# Patient Record
Sex: Male | Born: 1975 | Race: Black or African American | Hispanic: No | Marital: Single | Smoking: Current every day smoker
Health system: Southern US, Community
[De-identification: ages and names within clinical notes are randomized; demographics above are authoritative.]

## PROBLEM LIST (undated history)

## (undated) DIAGNOSIS — S82899A Other fracture of unspecified lower leg, initial encounter for closed fracture: Secondary | ICD-10-CM

## (undated) HISTORY — PX: NO PAST SURGERIES: SHX2092

---

## 2012-12-01 ENCOUNTER — Emergency Department (INDEPENDENT_AMBULATORY_CARE_PROVIDER_SITE_OTHER)
Admission: EM | Admit: 2012-12-01 | Discharge: 2012-12-01 | Disposition: A | Discharge status: Home / Self Care | Payer: Commercial Indemnity | Source: Home / Self Care | Attending: Family Medicine | Admitting: Family Medicine

## 2012-12-01 ENCOUNTER — Emergency Department (INDEPENDENT_AMBULATORY_CARE_PROVIDER_SITE_OTHER): Payer: Commercial Indemnity

## 2012-12-01 ENCOUNTER — Encounter (HOSPITAL_COMMUNITY): Payer: Self-pay | Admitting: Emergency Medicine

## 2012-12-01 DIAGNOSIS — S43109A Unspecified dislocation of unspecified acromioclavicular joint, initial encounter: Secondary | ICD-10-CM

## 2012-12-01 DIAGNOSIS — S43101A Unspecified dislocation of right acromioclavicular joint, initial encounter: Secondary | ICD-10-CM

## 2012-12-01 NOTE — ED Provider Notes (Signed)
History     CSN: [id number]  Arrival date & time 12/01/12  1031   First MD Initiated Contact with Patient 12/01/12 1108      Chief Complaint  Patient presents with  . Fall    (Consider location/radiation/quality/duration/timing/severity/associated sxs/prior treatment) Patient is a 37 y.o. male presenting with fall. The history is provided by the patient.  Fall The accident occurred 12 to 24 hours ago. The fall occurred from a ladder. He fell from a height of 6 to 10 ft. He landed on grass. The point of impact was the right shoulder. The pain is mild. He was ambulatory at the scene. There was no drug use involved in the accident. Pertinent negatives include no fever, no abdominal pain and no loss of consciousness.    History reviewed. No pertinent past medical history.  History reviewed. No pertinent past surgical history.  No family history on file.  History  Substance Use Topics  . Smoking status: Current Every Day Smoker  . Smokeless tobacco: Not on file  . Alcohol Use: Yes      Review of Systems  Constitutional: Negative.  Negative for fever.  Gastrointestinal: Negative for abdominal pain.  Musculoskeletal: Positive for joint swelling.  Skin: Negative.   Neurological: Negative.  Negative for loss of consciousness.    Allergies  Review of patient's allergies indicates no known allergies.  Home Medications  No current outpatient prescriptions on file.  BP 151/109  Pulse 105  Temp(Src) 98 F (36.7 C) (Oral)  Resp 23  SpO2 100%  Physical Exam  Nursing note and vitals reviewed. Constitutional: He is oriented to person, place, and time. He appears well-developed and well-nourished. No distress.  HENT:  Head: Normocephalic and atraumatic.  Eyes: Pupils are equal, round, and reactive to light.  Neck: Normal range of motion. Neck supple.  Musculoskeletal: He exhibits tenderness.       Right shoulder: He exhibits bony tenderness and swelling. He exhibits normal  range of motion.       Arms: Neurological: He is alert and oriented to person, place, and time.  Skin: Skin is warm and dry.    ED Course  Procedures (including critical care time)  Labs Reviewed - No data to display Dg Clavicle Right  12/01/2012  *RADIOLOGY REPORT*  Clinical Data: Fall, right shoulder pain  RIGHT CLAVICLE - 2+ VIEWS  Comparison: None.  Findings: Minimal AC joint degenerative change identified.  No clavicular fracture is identified.  Right lung apex is clear.  IMPRESSION: No clavicular fracture identified.   Original Report Authenticated By: Christiana Pellant, M.D.    Dg Ac Joints  12/01/2012  *RADIOLOGY REPORT*  Clinical Data: Fall, right shoulder pain  LEFT ACROMIOCLAVICULAR JOINTS  Comparison: Right clavicle radiographs same date  Findings: No displaced clavicular fracture.  Lung apices are clear. The AC joints are normal in appearance. Mild right AC joint degenerative change noted.  IMPRESSION: No AC joint displacement identified.   Original Report Authenticated By: Christiana Pellant, M.D.      1. Acromioclavicular joint separation, type 1, right, initial encounter       MDM  X-rays reviewed and report per radiologist.         Linna Hoff, MD 12/01/12 1227

## 2012-12-01 NOTE — ED Notes (Signed)
Patient reports fall yesterday 5/1.  Patient was coming down a ladder, fell about 8 feet , landing on bush, then ground ( grassy area).  Noted right shoulder pain right away.  Last night woke with pain, and reports following internet instructions.  Patient has less swelling, less pain and greater range of motion today per patient.

## 2016-04-05 ENCOUNTER — Ambulatory Visit (HOSPITAL_COMMUNITY): Payer: Worker's Compensation

## 2016-04-05 ENCOUNTER — Emergency Department (HOSPITAL_COMMUNITY)
Admission: EM | Admit: 2016-04-05 | Discharge: 2016-04-05 | Disposition: A | Discharge status: Home / Self Care | Payer: Worker's Compensation | Attending: Emergency Medicine | Admitting: Emergency Medicine

## 2016-04-05 ENCOUNTER — Encounter (HOSPITAL_COMMUNITY): Payer: Self-pay

## 2016-04-05 DIAGNOSIS — S82841A Displaced bimalleolar fracture of right lower leg, initial encounter for closed fracture: Secondary | ICD-10-CM | POA: Diagnosis not present

## 2016-04-05 DIAGNOSIS — S92351A Displaced fracture of fifth metatarsal bone, right foot, initial encounter for closed fracture: Secondary | ICD-10-CM | POA: Insufficient documentation

## 2016-04-05 DIAGNOSIS — Y939 Activity, unspecified: Secondary | ICD-10-CM | POA: Diagnosis not present

## 2016-04-05 DIAGNOSIS — S92301A Fracture of unspecified metatarsal bone(s), right foot, initial encounter for closed fracture: Secondary | ICD-10-CM

## 2016-04-05 DIAGNOSIS — S92211A Displaced fracture of cuboid bone of right foot, initial encounter for closed fracture: Secondary | ICD-10-CM

## 2016-04-05 DIAGNOSIS — Y929 Unspecified place or not applicable: Secondary | ICD-10-CM | POA: Diagnosis not present

## 2016-04-05 DIAGNOSIS — W230XXA Caught, crushed, jammed, or pinched between moving objects, initial encounter: Secondary | ICD-10-CM | POA: Diagnosis not present

## 2016-04-05 DIAGNOSIS — Y999 Unspecified external cause status: Secondary | ICD-10-CM | POA: Insufficient documentation

## 2016-04-05 DIAGNOSIS — F17200 Nicotine dependence, unspecified, uncomplicated: Secondary | ICD-10-CM | POA: Insufficient documentation

## 2016-04-05 DIAGNOSIS — S99921A Unspecified injury of right foot, initial encounter: Secondary | ICD-10-CM | POA: Diagnosis present

## 2016-04-05 MED ORDER — NAPROXEN 500 MG PO TABS: 500.0000 mg | ORAL_TABLET | Freq: Two times a day (BID) | ORAL | 0 refills | Status: AC

## 2016-04-05 MED ORDER — HYDROCODONE-ACETAMINOPHEN 5-325 MG PO TABS: 1.0000 | ORAL_TABLET | ORAL | 0 refills | Status: DC | PRN

## 2016-04-05 NOTE — ED Triage Notes (Addendum)
Patient here with right ankle and foot pain with swelling after foot pushed by electric jack into wall, had on heavy boots, no obvious deformity, swelling noted, full ROM

## 2016-04-05 NOTE — Progress Notes (Signed)
Orthopedic Tech Progress Note Patient Details:  [name] Floyd [birth year] [id number]  Ortho Devices Type of Ortho Device: Crutches, Post (short leg) splint Ortho Device/Splint Interventions: Application   [name] 04/05/2016, 5:15 PM

## 2016-04-05 NOTE — Discharge Instructions (Signed)
Please follow up with Dr. Magnus IvanBlackman at [facility] as soon as possible. In the meantime take pain medication as prescribed as needed.

## 2016-04-05 NOTE — ED Provider Notes (Signed)
[facility]-EMERGENCY DEPT Provider Note   CSN: [id number] Arrival date & time: 04/05/16  1303    By signing my name below, I, Sonum Patel, attest that this documentation has been prepared under the direction and in the presence of Artice Holohan, [place]. Electronically Signed: Sonum Patel, Neurosurgeon. 04/05/16. 1:37 PM.  History   Chief Complaint Chief Complaint  Patient presents with  . Ankle Injury    The history is provided by the patient. No language interpreter was used.     HPI Comments: [name] Floyd is a 40 y.o. male who presents to the Emergency Department complaining of a right ankle and foot injury that occurred PTA. Patient states his foot was caught in a hand lift and became inverted. He reports mild right ankle and foot pain with associated swelling. He describes the ankle pain as an ache and reports feeling pressure from the swelling. He has taken ibuprofen 250 mg. He states he can bear weight but it is painful.    History reviewed. No pertinent past medical history.  There are no active problems to display for this patient.   History reviewed. No pertinent surgical history.     Home Medications    Prior to Admission medications   Not on File    Family History No family history on file.  Social History Social History  Substance Use Topics  . Smoking status: Current Every Day Smoker  . Smokeless tobacco: Never Used  . Alcohol use Yes     Allergies   Review of patient's allergies indicates no known allergies.   Review of Systems Review of Systems  10 Systems reviewed and all are negative for acute change except as noted in the HPI.   Physical Exam Updated Vital Signs BP 103/90   Pulse 92   Temp 97.9 F (36.6 C) (Oral)   Resp 18   SpO2 100%   Physical Exam  Constitutional: He is oriented to person, place, and time. He appears well-developed and well-nourished.  HENT:  Head: Normocephalic and atraumatic.  Cardiovascular: Normal rate and intact distal  pulses.   Pulmonary/Chest: Effort normal.  Musculoskeletal:  Right lower extremity with edema from ankle through dorsum of foot particularly along lateral dorsum. Mild limited ROM of right ankle. Tenderness to lateral path of dorsum of foot. Otherwise no other tenderness. 2+ DP/PT pulses. Brisk cap refill.   Neurological: He is alert and oriented to person, place, and time.  Skin: Skin is warm and dry.  Psychiatric: He has a normal mood and affect.  Nursing note and vitals reviewed.    ED Treatments / Results  DIAGNOSTIC STUDIES: Oxygen Saturation is 100% on RA, normal by my interpretation.    COORDINATION OF CARE: 1:37 PM Discussed treatment plan with pt at bedside and pt agreed to plan.   Labs (all labs ordered are listed, but only abnormal results are displayed) Labs Reviewed - No data to display  EKG  EKG Interpretation None       Radiology Dg Ankle Complete Right  Result Date: 04/05/2016 CLINICAL DATA:  Crush injury to right foot and ankle. Initial encounter. EXAM: RIGHT ANKLE - COMPLETE 3+ VIEW COMPARISON:  None. FINDINGS: Mildly displaced bimalleolar fracture is seen involving the lateral and medial malleoli. Overlying soft tissue swelling present. The talar dome appears intact. No visible posterior malleolar fracture. IMPRESSION: Mild displaced bimalleolar fracture involving both medial and lateral malleoli of the right ankle. Electronically Signed   By: Irish Lack M.D.   On: 04/05/2016 14:02  Dg Foot Complete Right  Result Date: 04/05/2016 CLINICAL DATA:  Crush injury to right foot and ankle. Initial encounter. EXAM: RIGHT FOOT COMPLETE - 3+ VIEW COMPARISON:  None. FINDINGS: There is a comminuted fracture of the fifth metatarsal demonstrating displacement of multiple fracture fragments. Avulsive fracture also noted on the lateral view involving the plantar aspect of the cuboid. Fracture planes are suspected to extend into the cuboid bone and the cuboid bone is also  mildly displaced in appearance. There is also likely relatively nondisplaced fracture at the level of the proximal fourth metatarsal. IMPRESSION: 1. Comminuted fracture of the fifth metatarsal with displacement of multiple fracture fragments. 2. Avulsive injury involving the plantar aspect of the cuboid. Other fracture planes also may extend into the cuboid itself and the cuboid appears rotated. 3. Probable nondisplaced/minimally displaced fracture at the base of the fourth metatarsal. Electronically Signed   By: Irish LackGlenn  Yamagata M.D.   On: 04/05/2016 14:06    Procedures Procedures (including critical care time)  Medications Ordered in ED Medications - No data to display   Initial Impression / Assessment and Plan / ED Course  I have reviewed the triage vital signs and the nursing notes.  Pertinent labs & imaging results that were available during my care of the patient were reviewed by me and considered in my medical decision making (see chart for details).  Clinical Course   X-rays reveal a mildly displaced bimalleolar fracture with comminuted fifth metatarsal fracture, cuboid avulsive fracture with rotation of the cuboid bone, and likely fourth metatarsal fracture. I spoke with Dr. Magnus IvanBlackman of ortho who recommends pt will need surgery eventually but non emergently. Pt placed in posterior short leg splint given crutches and instructions for NWB. Instructed f/u with ortho ASAP.  Final Clinical Impressions(s) / ED Diagnoses   Final diagnoses:  Bimalleolar fracture, right, closed, initial encounter  Metatarsal fracture, right, closed, initial encounter  Cuboid fracture, right, closed, initial encounter    New Prescriptions Discharge Medication List as of 04/05/2016  4:23 PM    START taking these medications   Details  HYDROcodone-acetaminophen (NORCO/VICODIN) 5-325 MG tablet Take 1-2 tablets by mouth every 4 (four) hours as needed., Starting Mon 04/05/2016, Print    naproxen (NAPROSYN) 500  MG tablet Take 1 tablet (500 mg total) by mouth 2 (two) times daily., Starting Mon 04/05/2016, Print       I personally performed the services described in this documentation, which was scribed in my presence. The recorded information has been reviewed and is accurate.    Carlene CoriaSerena Y Jensen Cheramie, PA-C 04/06/16 0830    Lorre NickAnthony Allen, MD 04/06/16 1710

## 2016-04-08 ENCOUNTER — Other Ambulatory Visit: Payer: Self-pay | Admitting: Physician Assistant

## 2016-04-13 ENCOUNTER — Encounter (HOSPITAL_COMMUNITY): Payer: Self-pay

## 2016-04-13 ENCOUNTER — Encounter (HOSPITAL_COMMUNITY)
Admission: RE | Admit: 2016-04-13 | Discharge: 2016-04-13 | Disposition: A | Discharge status: Home / Self Care | Payer: Worker's Compensation | Source: Ambulatory Visit | Attending: Orthopaedic Surgery | Admitting: Orthopaedic Surgery

## 2016-04-13 DIAGNOSIS — Z79899 Other long term (current) drug therapy: Secondary | ICD-10-CM | POA: Diagnosis not present

## 2016-04-13 DIAGNOSIS — F17210 Nicotine dependence, cigarettes, uncomplicated: Secondary | ICD-10-CM | POA: Diagnosis not present

## 2016-04-13 DIAGNOSIS — S82841A Displaced bimalleolar fracture of right lower leg, initial encounter for closed fracture: Secondary | ICD-10-CM | POA: Diagnosis not present

## 2016-04-13 DIAGNOSIS — S9781XA Crushing injury of right foot, initial encounter: Secondary | ICD-10-CM | POA: Diagnosis present

## 2016-04-13 DIAGNOSIS — D649 Anemia, unspecified: Secondary | ICD-10-CM | POA: Diagnosis not present

## 2016-04-13 DIAGNOSIS — W230XXA Caught, crushed, jammed, or pinched between moving objects, initial encounter: Secondary | ICD-10-CM | POA: Diagnosis not present

## 2016-04-13 HISTORY — DX: Other fracture of unspecified lower leg, initial encounter for closed fracture: S82.899A

## 2016-04-13 LAB — BASIC METABOLIC PANEL
Anion gap: 8 (ref 5–15)
BUN: 11 mg/dL (ref 6–20)
CHLORIDE: 105 mmol/L (ref 101–111)
CO2: 25 mmol/L (ref 22–32)
Calcium: 9.5 mg/dL (ref 8.9–10.3)
Creatinine, Ser: 0.8 mg/dL (ref 0.61–1.24)
GFR calc non Af Amer: >60 mL/min (ref >60)
Glucose, Bld: 104 mg/dL — ABNORMAL HIGH (ref 65–99)
POTASSIUM: 4 mmol/L (ref 3.5–5.1)
SODIUM: 138 mmol/L (ref 135–145)

## 2016-04-13 LAB — CBC
HEMATOCRIT: 37.8 % — AB (ref 39.0–52.0)
HEMOGLOBIN: 12.3 g/dL — AB (ref 13.0–17.0)
MCH: 31 pg (ref 26.0–34.0)
MCHC: 32.5 g/dL (ref 30.0–36.0)
MCV: 95.2 fL (ref 78.0–100.0)
Platelets: 326 10*3/uL (ref 150–400)
RBC: 3.97 MIL/uL — AB (ref 4.22–5.81)
RDW: 13.8 % (ref 11.5–15.5)
WBC: 5.8 10*3/uL (ref 4.0–10.5)

## 2016-04-13 NOTE — Pre-Procedure Instructions (Signed)
Juliann ParesBarry Lazenby  04/13/2016      [facility] [address]- [place], [place] - [address] [place] [id number] Phone: [phone] Fax: [phone]    Your procedure is scheduled on 04/15/16  [facility] Admitting at 530 A.M.  Call this number if you have problems the morning of surgery:  [phone]   Remember:  Do not eat food or drink liquids after midnight.  Take these medicines the morning of surgery with A SIP OF WATER hydrocodone if needed  STOP all herbel meds, nsaids (aleve,naproxen,advil,ibuprofen)  Starting NOW (04/13/16) including vitamins, aspirin   Do not wear jewelry, make-up or nail polish.  Do not wear lotions, powders, or perfumes, or deoderant.  Do not shave 48 hours prior to surgery.  Men may shave face and neck.  Do not bring valuables to the hospital.  [facility] is not responsible for any belongings or valuables.  Contacts, dentures or bridgework may not be worn into surgery.  Leave your suitcase in the car.  After surgery it may be brought to your room.  For patients admitted to the hospital, discharge time will be determined by your treatment team.  Patients discharged the day of surgery will not be allowed to drive home.   Name and phone number of your driver:   Special instructions:  Special Instructions: [place] - Preparing for Surgery  Before surgery, you can play an important role.  Because skin is not sterile, your skin needs to be as free of germs as possible.  You can reduce the number of germs on you skin by washing with CHG (chlorahexidine gluconate) soap before surgery.  CHG is an antiseptic cleaner which kills germs and bonds with the skin to continue killing germs even after washing.  Please DO NOT use if you have an allergy to CHG or antibacterial soaps.  If your skin becomes reddened/irritated stop using the CHG and inform your nurse when you arrive at Short Stay.  Do not  shave (including legs and underarms) for at least 48 hours prior to the first CHG shower.  You may shave your face.  Please follow these instructions carefully:   1.  Shower with CHG Soap the night before surgery and the morning of Surgery.  2.  If you choose to wash your hair, wash your hair first as usual with your normal shampoo.  3.  After you shampoo, rinse your hair and body thoroughly to remove the Shampoo.  4.  Use CHG as you would any other liquid soap.  You can apply chg directly  to the skin and wash gently with scrungie or a clean washcloth.  5.  Apply the CHG Soap to your body ONLY FROM THE NECK DOWN.  Do not use on open wounds or open sores.  Avoid contact with your eyes ears, mouth and genitals (private parts).  Wash genitals (private parts)       with your normal soap.  6.  Wash thoroughly, paying special attention to the area where your surgery will be performed.  7.  Thoroughly rinse your body with warm water from the neck down.  8.  DO NOT shower/wash with your normal soap after using and rinsing off the CHG Soap.  9.  Pat yourself dry with a clean towel.            10.  Wear clean pajamas.  11.  Place clean sheets on your bed the night of your first shower and do not sleep with pets.  Day of Surgery  Do not apply any lotions/deodorants the morning of surgery.  Please wear clean clothes to the hospital/surgery center.  Please read over the  fact sheets that you were given.

## 2016-04-14 ENCOUNTER — Ambulatory Visit
Admission: RE | Admit: 2016-04-14 | Discharge: 2016-04-14 | Disposition: A | Discharge status: Home / Self Care | Payer: Worker's Compensation | Source: Ambulatory Visit | Attending: Physician Assistant | Admitting: Physician Assistant

## 2016-04-14 ENCOUNTER — Other Ambulatory Visit: Payer: Self-pay | Admitting: Physician Assistant

## 2016-04-14 DIAGNOSIS — M79671 Pain in right foot: Secondary | ICD-10-CM

## 2016-04-15 ENCOUNTER — Ambulatory Visit (HOSPITAL_COMMUNITY): Payer: Worker's Compensation

## 2016-04-15 ENCOUNTER — Encounter (HOSPITAL_COMMUNITY)
Admission: RE | Disposition: A | Discharge status: Home / Self Care | Payer: Self-pay | Source: Ambulatory Visit | Attending: Orthopaedic Surgery

## 2016-04-15 ENCOUNTER — Observation Stay (HOSPITAL_COMMUNITY)
Admission: RE | Admit: 2016-04-15 | Discharge: 2016-04-16 | Disposition: A | Discharge status: Home / Self Care | Payer: Worker's Compensation | Source: Ambulatory Visit | Attending: Orthopaedic Surgery | Admitting: Orthopaedic Surgery

## 2016-04-15 ENCOUNTER — Ambulatory Visit (HOSPITAL_COMMUNITY): Payer: Worker's Compensation | Admitting: Anesthesiology

## 2016-04-15 ENCOUNTER — Encounter (HOSPITAL_COMMUNITY): Payer: Self-pay | Admitting: *Deleted

## 2016-04-15 DIAGNOSIS — Z9889 Other specified postprocedural states: Secondary | ICD-10-CM

## 2016-04-15 DIAGNOSIS — S9781XA Crushing injury of right foot, initial encounter: Secondary | ICD-10-CM | POA: Insufficient documentation

## 2016-04-15 DIAGNOSIS — Z79899 Other long term (current) drug therapy: Secondary | ICD-10-CM | POA: Insufficient documentation

## 2016-04-15 DIAGNOSIS — Z8781 Personal history of (healed) traumatic fracture: Secondary | ICD-10-CM

## 2016-04-15 DIAGNOSIS — F17210 Nicotine dependence, cigarettes, uncomplicated: Secondary | ICD-10-CM | POA: Insufficient documentation

## 2016-04-15 DIAGNOSIS — S82841A Displaced bimalleolar fracture of right lower leg, initial encounter for closed fracture: Secondary | ICD-10-CM | POA: Diagnosis not present

## 2016-04-15 DIAGNOSIS — Z419 Encounter for procedure for purposes other than remedying health state, unspecified: Secondary | ICD-10-CM

## 2016-04-15 DIAGNOSIS — D649 Anemia, unspecified: Secondary | ICD-10-CM | POA: Insufficient documentation

## 2016-04-15 DIAGNOSIS — W230XXA Caught, crushed, jammed, or pinched between moving objects, initial encounter: Secondary | ICD-10-CM | POA: Insufficient documentation

## 2016-04-15 HISTORY — PX: ORIF ANKLE FRACTURE: SHX5408

## 2016-04-15 HISTORY — PX: ORIF ANKLE FRACTURE BIMALLEOLAR: SUR920

## 2016-04-15 SURGERY — OPEN REDUCTION INTERNAL FIXATION (ORIF) ANKLE FRACTURE
Anesthesia: Regional | Laterality: Right

## 2016-04-15 MED ORDER — DIPHENHYDRAMINE HCL 12.5 MG/5ML PO ELIX: 12.5000 mg | ORAL_SOLUTION | ORAL | Status: DC | PRN

## 2016-04-15 MED ORDER — GLYCOPYRROLATE 0.2 MG/ML IJ SOLN
INTRAMUSCULAR | Status: DC | PRN
  Administered 2016-04-15: 0.2 mg via INTRAVENOUS

## 2016-04-15 MED ORDER — MIDAZOLAM HCL 5 MG/5ML IJ SOLN
INTRAMUSCULAR | Status: DC | PRN
  Administered 2016-04-15: 2 mg via INTRAVENOUS

## 2016-04-15 MED ORDER — LACTATED RINGERS IV SOLN
INTRAVENOUS | Status: DC | PRN
  Administered 2016-04-15 (×2): via INTRAVENOUS

## 2016-04-15 MED ORDER — CHLORHEXIDINE GLUCONATE 4 % EX LIQD: 60.0000 mL | Freq: Once | CUTANEOUS | Status: DC

## 2016-04-15 MED ORDER — EPHEDRINE SULFATE 50 MG/ML IJ SOLN
INTRAMUSCULAR | Status: DC | PRN
  Administered 2016-04-15: 5 mg via INTRAVENOUS

## 2016-04-15 MED ORDER — ONDANSETRON HCL 4 MG/2ML IJ SOLN
INTRAMUSCULAR | Status: AC
  Filled 2016-04-15: qty 2

## 2016-04-15 MED ORDER — CEFAZOLIN SODIUM-DEXTROSE 2-4 GM/100ML-% IV SOLN
2.0000 g | INTRAVENOUS | Status: AC
  Administered 2016-04-15: 2 g via INTRAVENOUS
  Filled 2016-04-15: qty 100

## 2016-04-15 MED ORDER — METHOCARBAMOL 500 MG PO TABS
500.0000 mg | ORAL_TABLET | Freq: Four times a day (QID) | ORAL | Status: DC | PRN
  Administered 2016-04-16: 500 mg via ORAL
  Filled 2016-04-15: qty 1

## 2016-04-15 MED ORDER — ONDANSETRON HCL 4 MG PO TABS: 4.0000 mg | ORAL_TABLET | Freq: Four times a day (QID) | ORAL | Status: DC | PRN

## 2016-04-15 MED ORDER — PROPOFOL 10 MG/ML IV BOLUS
INTRAVENOUS | Status: AC
  Filled 2016-04-15: qty 20

## 2016-04-15 MED ORDER — METOCLOPRAMIDE HCL 5 MG/ML IJ SOLN: 5.0000 mg | Freq: Three times a day (TID) | INTRAMUSCULAR | Status: DC | PRN

## 2016-04-15 MED ORDER — FENTANYL CITRATE (PF) 100 MCG/2ML IJ SOLN
INTRAMUSCULAR | Status: AC
  Filled 2016-04-15: qty 2

## 2016-04-15 MED ORDER — PROMETHAZINE HCL 25 MG/ML IJ SOLN: 6.2500 mg | INTRAMUSCULAR | Status: DC | PRN

## 2016-04-15 MED ORDER — BUPIVACAINE-EPINEPHRINE (PF) 0.5% -1:200000 IJ SOLN
INTRAMUSCULAR | Status: DC | PRN
  Administered 2016-04-15: 40 mL via PERINEURAL

## 2016-04-15 MED ORDER — ROCURONIUM BROMIDE 10 MG/ML (PF) SYRINGE
PREFILLED_SYRINGE | INTRAVENOUS | Status: AC
  Filled 2016-04-15: qty 10

## 2016-04-15 MED ORDER — PHENYLEPHRINE HCL 10 MG/ML IJ SOLN
INTRAMUSCULAR | Status: DC | PRN
  Administered 2016-04-15: 80 ug via INTRAVENOUS
  Administered 2016-04-15: 120 ug via INTRAVENOUS
  Administered 2016-04-15: 80 ug via INTRAVENOUS

## 2016-04-15 MED ORDER — OXYCODONE HCL 5 MG PO TABS
5.0000 mg | ORAL_TABLET | ORAL | Status: DC | PRN
  Administered 2016-04-16 (×2): 10 mg via ORAL
  Filled 2016-04-15 (×2): qty 2

## 2016-04-15 MED ORDER — ACETAMINOPHEN 325 MG PO TABS: 650.0000 mg | ORAL_TABLET | Freq: Four times a day (QID) | ORAL | Status: DC | PRN

## 2016-04-15 MED ORDER — EPHEDRINE 5 MG/ML INJ
INTRAVENOUS | Status: AC
  Filled 2016-04-15: qty 10

## 2016-04-15 MED ORDER — OXYCODONE-ACETAMINOPHEN 5-325 MG PO TABS: 1.0000 | ORAL_TABLET | ORAL | 0 refills | Status: AC | PRN

## 2016-04-15 MED ORDER — HYDROMORPHONE HCL 1 MG/ML IJ SOLN: 1.0000 mg | INTRAMUSCULAR | Status: DC | PRN

## 2016-04-15 MED ORDER — MIDAZOLAM HCL 2 MG/2ML IJ SOLN
INTRAMUSCULAR | Status: AC
  Filled 2016-04-15: qty 2

## 2016-04-15 MED ORDER — FENTANYL CITRATE (PF) 100 MCG/2ML IJ SOLN
INTRAMUSCULAR | Status: DC | PRN
  Administered 2016-04-15 (×2): 50 ug via INTRAVENOUS
  Administered 2016-04-15 (×2): 100 ug via INTRAVENOUS

## 2016-04-15 MED ORDER — LIDOCAINE 2% (20 MG/ML) 5 ML SYRINGE
INTRAMUSCULAR | Status: DC | PRN
  Administered 2016-04-15: 60 mg via INTRAVENOUS

## 2016-04-15 MED ORDER — METOCLOPRAMIDE HCL 5 MG PO TABS: 5.0000 mg | ORAL_TABLET | Freq: Three times a day (TID) | ORAL | Status: DC | PRN

## 2016-04-15 MED ORDER — DEXAMETHASONE SODIUM PHOSPHATE 4 MG/ML IJ SOLN
INTRAMUSCULAR | Status: DC | PRN
  Administered 2016-04-15: 10 mg via INTRAVENOUS

## 2016-04-15 MED ORDER — ACETAMINOPHEN 650 MG RE SUPP: 650.0000 mg | Freq: Four times a day (QID) | RECTAL | Status: DC | PRN

## 2016-04-15 MED ORDER — ONDANSETRON HCL 4 MG/2ML IJ SOLN
INTRAMUSCULAR | Status: DC | PRN
  Administered 2016-04-15: 4 mg via INTRAVENOUS

## 2016-04-15 MED ORDER — DEXAMETHASONE SODIUM PHOSPHATE 10 MG/ML IJ SOLN
INTRAMUSCULAR | Status: AC
  Filled 2016-04-15: qty 1

## 2016-04-15 MED ORDER — CEFAZOLIN IN D5W 1 GM/50ML IV SOLN
1.0000 g | Freq: Four times a day (QID) | INTRAVENOUS | Status: AC
  Administered 2016-04-15 – 2016-04-16 (×3): 1 g via INTRAVENOUS
  Filled 2016-04-15 (×3): qty 50

## 2016-04-15 MED ORDER — FENTANYL CITRATE (PF) 100 MCG/2ML IJ SOLN: 25.0000 ug | INTRAMUSCULAR | Status: DC | PRN

## 2016-04-15 MED ORDER — 0.9 % SODIUM CHLORIDE (POUR BTL) OPTIME
TOPICAL | Status: DC | PRN
  Administered 2016-04-15: 1000 mL

## 2016-04-15 MED ORDER — ONDANSETRON HCL 4 MG/2ML IJ SOLN: 4.0000 mg | Freq: Four times a day (QID) | INTRAMUSCULAR | Status: DC | PRN

## 2016-04-15 MED ORDER — PROPOFOL 10 MG/ML IV BOLUS
INTRAVENOUS | Status: DC | PRN
  Administered 2016-04-15: 200 mg via INTRAVENOUS

## 2016-04-15 MED ORDER — SODIUM CHLORIDE 0.9 % IV SOLN
INTRAVENOUS | Status: DC
  Administered 2016-04-15: 14:00:00 via INTRAVENOUS

## 2016-04-15 MED ORDER — METHOCARBAMOL 1000 MG/10ML IJ SOLN
500.0000 mg | Freq: Four times a day (QID) | INTRAVENOUS | Status: DC | PRN
  Filled 2016-04-15: qty 5

## 2016-04-15 MED ORDER — GLYCOPYRROLATE 0.2 MG/ML IV SOSY
PREFILLED_SYRINGE | INTRAVENOUS | Status: AC
  Filled 2016-04-15: qty 3

## 2016-04-15 MED ORDER — PHENYLEPHRINE 40 MCG/ML (10ML) SYRINGE FOR IV PUSH (FOR BLOOD PRESSURE SUPPORT)
PREFILLED_SYRINGE | INTRAVENOUS | Status: AC
  Filled 2016-04-15: qty 10

## 2016-04-15 MED ORDER — LIDOCAINE 2% (20 MG/ML) 5 ML SYRINGE
INTRAMUSCULAR | Status: AC
  Filled 2016-04-15: qty 5

## 2016-04-15 MED ORDER — SUCCINYLCHOLINE CHLORIDE 200 MG/10ML IV SOSY
PREFILLED_SYRINGE | INTRAVENOUS | Status: AC
  Filled 2016-04-15: qty 10

## 2016-04-15 SURGICAL SUPPLY — 65 items
BANDAGE ACE 4X5 VEL STRL LF (GAUZE/BANDAGES/DRESSINGS) ×3 IMPLANT
BANDAGE ACE 6X5 VEL STRL LF (GAUZE/BANDAGES/DRESSINGS) ×3 IMPLANT
BANDAGE ESMARK 6X9 LF (GAUZE/BANDAGES/DRESSINGS) IMPLANT
BIT DRILL 2.5X125 (BIT) ×3 IMPLANT
BNDG ESMARK 6X9 LF (GAUZE/BANDAGES/DRESSINGS)
BNDG GAUZE ELAST 4 BULKY (GAUZE/BANDAGES/DRESSINGS) ×3 IMPLANT
COVER SURGICAL LIGHT HANDLE (MISCELLANEOUS) ×3 IMPLANT
CUFF TOURNIQUET SINGLE 34IN LL (TOURNIQUET CUFF) IMPLANT
CUFF TOURNIQUET SINGLE 44IN (TOURNIQUET CUFF) IMPLANT
DRAPE C-ARM 42X72 X-RAY (DRAPES) ×3 IMPLANT
DRAPE U-SHAPE 47X51 STRL (DRAPES) ×3 IMPLANT
DRILL 2.6X122MM WL AO SHAFT (BIT) ×3 IMPLANT
DRSG PAD ABDOMINAL 8X10 ST (GAUZE/BANDAGES/DRESSINGS) ×3 IMPLANT
DURAPREP 26ML APPLICATOR (WOUND CARE) ×3 IMPLANT
ELECT REM PT RETURN 9FT ADLT (ELECTROSURGICAL) ×3
ELECTRODE REM PT RTRN 9FT ADLT (ELECTROSURGICAL) ×1 IMPLANT
GAUZE SPONGE 4X4 12PLY STRL (GAUZE/BANDAGES/DRESSINGS) ×3 IMPLANT
GAUZE XEROFORM 5X9 LF (GAUZE/BANDAGES/DRESSINGS) ×3 IMPLANT
GLOVE BIO SURGEON STRL SZ8 (GLOVE) ×3 IMPLANT
GLOVE ORTHO TXT STRL SZ7.5 (GLOVE) ×3 IMPLANT
GOWN STRL REUS W/ TWL LRG LVL3 (GOWN DISPOSABLE) ×2 IMPLANT
GOWN STRL REUS W/ TWL XL LVL3 (GOWN DISPOSABLE) ×4 IMPLANT
GOWN STRL REUS W/TWL LRG LVL3 (GOWN DISPOSABLE) ×4
GOWN STRL REUS W/TWL XL LVL3 (GOWN DISPOSABLE) ×8
K-WIRE ORTHOPEDIC 1.4X150L (WIRE) ×6
KIT BASIN OR (CUSTOM PROCEDURE TRAY) ×3 IMPLANT
KIT ROOM TURNOVER OR (KITS) ×3 IMPLANT
KWIRE ORTHOPEDIC 1.4X150L (WIRE) ×2 IMPLANT
MANIFOLD NEPTUNE II (INSTRUMENTS) ×3 IMPLANT
NEEDLE HYPO 25GX1X1/2 BEV (NEEDLE) IMPLANT
NS IRRIG 1000ML POUR BTL (IV SOLUTION) ×3 IMPLANT
PACK ORTHO EXTREMITY (CUSTOM PROCEDURE TRAY) ×3 IMPLANT
PAD ARMBOARD 7.5X6 YLW CONV (MISCELLANEOUS) ×6 IMPLANT
PAD CAST 4YDX4 CTTN HI CHSV (CAST SUPPLIES) ×1 IMPLANT
PADDING CAST COTTON 4X4 STRL (CAST SUPPLIES) ×2
PADDING CAST COTTON 6X4 STRL (CAST SUPPLIES) ×3 IMPLANT
PLATE DISTAL FIBULA 3HOLE (Plate) ×3 IMPLANT
PLATE STRAIGHT 4 HOLE (Plate) ×3 IMPLANT
PREFILTER NEPTUNE (MISCELLANEOUS) ×3 IMPLANT
SCREW BONE ANKLE 3.5X12MM (Screw) ×3 IMPLANT
SCREW BONE ANKLE 3.5X14MM (Screw) ×2 IMPLANT
SCREW BONE NON-LCKING 3.5X12MM (Screw) ×9 IMPLANT
SCREW CANC FT 20X4X2.5XHEX (Screw) ×1 IMPLANT
SCREW CANCELLOUS 4.0X20 (Screw) ×2 IMPLANT
SCREW CANCELLOUS 4.0X30MM (Screw) ×2 IMPLANT
SCREW CORTEX ST MATTA 3.5X36MM (Screw) ×3 IMPLANT
SCREW CORTEX ST MATTA 3.5X40MM (Screw) ×3 IMPLANT
SCREW LOCKING 3.5X16MM (Screw) ×6 IMPLANT
SCREW LOCKING 3.5X18MM (Screw) ×3 IMPLANT
SPLINT PLASTER CAST XFAST 5X30 (CAST SUPPLIES) ×1 IMPLANT
SPLINT PLASTER XFAST SET 5X30 (CAST SUPPLIES) ×2
SPONGE GAUZE 4X4 12PLY STER LF (GAUZE/BANDAGES/DRESSINGS) ×3 IMPLANT
SPONGE LAP 4X18 X RAY DECT (DISPOSABLE) ×6 IMPLANT
SUCTION FRAZIER HANDLE 10FR (MISCELLANEOUS) ×2
SUCTION TUBE FRAZIER 10FR DISP (MISCELLANEOUS) ×1 IMPLANT
SUT ETHILON 2 0 FS 18 (SUTURE) ×3 IMPLANT
SUT VIC AB 2-0 CT1 27 (SUTURE) ×2
SUT VIC AB 2-0 CT1 27XBRD (SUTURE) ×1 IMPLANT
SUT VICRYL 0 CT 1 36IN (SUTURE) ×3 IMPLANT
SYR CONTROL 10ML LL (SYRINGE) IMPLANT
TOWEL OR 17X24 6PK STRL BLUE (TOWEL DISPOSABLE) ×3 IMPLANT
TOWEL OR 17X26 10 PK STRL BLUE (TOWEL DISPOSABLE) ×3 IMPLANT
TUBE CONNECTING 12'X1/4 (SUCTIONS) ×1
TUBE CONNECTING 12X1/4 (SUCTIONS) ×2 IMPLANT
WATER STERILE IRR 1000ML POUR (IV SOLUTION) ×3 IMPLANT

## 2016-04-15 NOTE — Discharge Instructions (Signed)
No weight on your right foot or ankle at all. Expect swelling - ice and elevation as needed. You can try to bend your ankle back and forth. Wear your boot only when up, but you do not have to sleep in your boot. Keep your dressings clean and dry. You can remove all or your dressings in one week and start getting your actual incisions, sutures, and wounds wet daily in the shower.

## 2016-04-15 NOTE — Brief Op Note (Signed)
04/15/2016  8:52 AM  PATIENT:  [name] Floyd  [age] y.o. male  PRE-OPERATIVE DIAGNOSIS:  right bimalleolar ankle fracture  POST-OPERATIVE DIAGNOSIS:  right bimalleolar ankle fracture  PROCEDURE:  Procedure(s): OPEN REDUCTION INTERNAL FIXATION (ORIF) RIGHT BIMALLEOLAR ANKLE FRACTURE (Right)  SURGEON:  Surgeon(s) and Role:    * Kathryne Hitchhristopher Y Ketrina Boateng, MD - Primary  PHYSICIAN ASSISTANT: Rexene EdisonGil Clark, PA-C  ANESTHESIA:   regional and general  EBL:  Total I/O In: 1000 [I.V.:1000] Out: 50 [Blood:50]  COUNTS:  YES  TOURNIQUET:   Total Tourniquet Time Documented: Thigh (Right) - 61 minutes Total: Thigh (Right) - 61 minutes   DICTATION: .Other Dictation: Dictation Number [id number]  PLAN OF CARE: Admit for overnight observation  PATIENT DISPOSITION:  PACU - hemodynamically stable.   Delay start of Pharmacological VTE agent (>24hrs) due to surgical blood loss or risk of bleeding: no

## 2016-04-15 NOTE — Transfer of Care (Signed)
Immediate Anesthesia Transfer of Care Note  Patient: [name] Floyd  Procedure(s) Performed: Procedure(s): OPEN REDUCTION INTERNAL FIXATION (ORIF) RIGHT BIMALLEOLAR ANKLE FRACTURE (Right)  Patient Location: PACU  Anesthesia Type:GA combined with regional for post-op pain  Level of Consciousness: awake, alert  and oriented  Airway & Oxygen Therapy: Patient Spontanous Breathing and Patient connected to face mask oxygen  Post-op Assessment: Report given to RN and Post -op Vital signs reviewed and stable  Post vital signs: Reviewed and stable  Last Vitals:  Vitals:   04/15/16 0723 04/15/16 0905  BP:  120/82  Pulse:  96  Resp: (!) 30 20  Temp:  36.3 C    Last Pain:  Vitals:   04/15/16 0905  TempSrc:   PainSc: (P) 0-No pain         Complications: No apparent anesthesia complications

## 2016-04-15 NOTE — Anesthesia Preprocedure Evaluation (Addendum)
Anesthesia Evaluation  Patient identified by MRN, date of birth, ID band Patient awake    Reviewed: Allergy & Precautions, NPO status , Patient's Chart, lab work & pertinent test results  Airway Mallampati: II  TM Distance: >3 FB Neck ROM: Full    Dental  (+) Teeth Intact, Dental Advisory Given   Pulmonary Current Smoker,    Pulmonary exam normal breath sounds clear to auscultation       Cardiovascular Exercise Tolerance: Good negative cardio ROS Normal cardiovascular exam Rhythm:Regular Rate:Normal     Neuro/Psych negative neurological ROS     GI/Hepatic negative GI ROS, Neg liver ROS,   Endo/Other  negative endocrine ROS  Renal/GU negative Renal ROS     Musculoskeletal negative musculoskeletal ROS (+)   Abdominal   Peds  Hematology  (+) Blood dyscrasia, anemia ,   Anesthesia Other Findings Day of surgery medications reviewed with the patient.  Reproductive/Obstetrics                             Anesthesia Physical Anesthesia Plan  ASA: II  Anesthesia Plan: General and Regional   Post-op Pain Management:  Regional for Post-op pain   Induction: Intravenous  Airway Management Planned: LMA  Additional Equipment:   Intra-op Plan:   Post-operative Plan: Extubation in OR  Informed Consent: I have reviewed the patients History and Physical, chart, labs and discussed the procedure including the risks, benefits and alternatives for the proposed anesthesia with the patient or authorized representative who has indicated his/her understanding and acceptance.   Dental advisory given  Plan Discussed with: CRNA  Anesthesia Plan Comments: (Risks/benefits of general anesthesia discussed with patient including risk of damage to teeth, lips, gum, and tongue, nausea/vomiting, allergic reactions to medications, and the possibility of heart attack, stroke and death.  All patient questions  answered.  Patient wishes to proceed.  Discussed risks and benefits of popliteal nerve block including failure, bleeding, infection, nerve damage, weakness. Discussed that the block may not prevent all of the pain. Questions answered. Patient consents to block. )       Anesthesia Quick Evaluation

## 2016-04-15 NOTE — H&P (Signed)
[name] Floyd is an 40 y.o. male.   Chief Complaint:   Right ankle pain; known complex fracture HPI:   40 yo male who had his right ankle crushed in a work-related accident.  Sustained a complex, unstable right ankle fracture.  Now presents for definitive fixation.  Past Medical History:  Diagnosis Date  . Fracture of ankle    rt    Past Surgical History:  Procedure Laterality Date  . NO PAST SURGERIES      History reviewed. No pertinent family history. Social History:  reports that he has been smoking Cigarettes.  He has a 9.00 pack-year smoking history. He has never used smokeless tobacco. He reports that he does not drink alcohol or use drugs.  Allergies: No Known Allergies  Medications Prior to Admission  Medication Sig Dispense Refill  . acetaminophen (TYLENOL) 500 MG tablet Take 1,000 mg by mouth every 6 (six) hours as needed for mild pain.    Marland Kitchen HYDROcodone-acetaminophen (NORCO/VICODIN) 5-325 MG tablet Take 1-2 tablets by mouth every 4 (four) hours as needed. 15 tablet 0  . ibuprofen (ADVIL,MOTRIN) 200 MG tablet Take 800 mg by mouth every 6 (six) hours as needed for moderate pain.    . naproxen (NAPROSYN) 500 MG tablet Take 1 tablet (500 mg total) by mouth 2 (two) times daily. 30 tablet 0    Results for orders placed or performed during the hospital encounter of 04/13/16 (from the past 48 hour(s))  CBC     Status: Abnormal   Collection Time: 04/13/16  2:56 PM  Result Value Ref Range   WBC 5.8 4.0 - 10.5 K/uL   RBC 3.97 (L) 4.22 - 5.81 MIL/uL   Hemoglobin 12.3 (L) 13.0 - 17.0 g/dL   HCT 37.8 (L) 39.0 - 52.0 %   MCV 95.2 78.0 - 100.0 fL   MCH 31.0 26.0 - 34.0 pg   MCHC 32.5 30.0 - 36.0 g/dL   RDW 13.8 11.5 - 15.5 %   Platelets 326 150 - 400 K/uL  Basic metabolic panel     Status: Abnormal   Collection Time: 04/13/16  2:56 PM  Result Value Ref Range   Sodium 138 135 - 145 mmol/L   Potassium 4.0 3.5 - 5.1 mmol/L   Chloride 105 101 - 111 mmol/L   CO2 25 22 - 32 mmol/L    Glucose, Bld 104 (H) 65 - 99 mg/dL   BUN 11 6 - 20 mg/dL   Creatinine, Ser 0.80 0.61 - 1.24 mg/dL   Calcium 9.5 8.9 - 10.3 mg/dL   GFR calc non Af Amer >60 >60 mL/min   GFR calc Af Amer >60 >60 mL/min    Comment: (NOTE) The eGFR has been calculated using the CKD EPI equation. This calculation has not been validated in all clinical situations. eGFR's persistently <60 mL/min signify possible Chronic Kidney Disease.    Anion gap 8 5 - 15   Ct Foot Right Wo Contrast  Result Date: 04/14/2016 CLINICAL DATA:  Crush injury of the right foot. EXAM: CT OF THE RIGHT FOOT WITHOUT CONTRAST TECHNIQUE: Multidetector CT imaging of the right foot was performed according to the standard protocol. Multiplanar CT image reconstructions were also generated. COMPARISON:  Radiographs 04/05/2016 FINDINGS: There is a severely comminuted displaced fracture involving the fifth metatarsal shaft and base. Comminuted fracture of the cuboid with intra-articular involvement at the calcaneocuboid joint and a maximum of 4.5 mm of separation at the joint. The other metatarsal bones are intact. There are  tiny fractures at the tarsal metatarsal joints suspicious for ligamentous able shins. Comminuted fractures of the middle and lateral cuneiforms. Small avulsion fracture noted in the region of Lisfranc ligament attachment at the base of the second metatarsal. Mildly displaced fracture involving the lateral aspect of the navicular bone with mild comminution. Maximum displacement is 5 mm. No fracture of the talus is identified. The calcaneus is intact. The calcaneal facets are maintained. The talonavicular joint is maintained. Largely transverse fracture through the distal fibular shaft just above the level of the ankle mortise. There is also a vertical shear type intra-articular fracture involving the distal tibia extending to the base of the medial malleolus. A small avulsion fractures also noted along the lateral margin of the tibia,  likely an avulsion injury from the interosseous ligament. IMPRESSION: 1. Severely comminuted displaced fractures of the fifth metatarsal shaft and base. 2. Comminuted fractures of the cuboid, lateral and middle cuneiforms. 3. Mildly displaced fracture involving the lateral aspect of the navicular bone. 4. Findings suspicious for a Lisfranc ligament injury with small avulsion fractures near its attachment site. Other ligamentous injuries are suspected given the small avulsion fractures at the tarsal metatarsal joints. 5. Fractures of the distal fibula and tibia as discussed above. Electronically Signed   By: Marijo Sanes M.D.   On: 04/14/2016 17:30    Review of Systems  All other systems reviewed and are negative.   Blood pressure (!) 138/98, pulse 87, temperature 98.7 F (37.1 C), temperature source Oral, resp. rate 18, height 5' 9"  (1.753 m), weight 78.5 kg (173 lb), SpO2 100 %. Physical Exam  Constitutional: He is oriented to person, place, and time. He appears well-developed and well-nourished.  HENT:  Head: Normocephalic and atraumatic.  Eyes: EOM are normal. Pupils are equal, round, and reactive to light.  Neck: Normal range of motion. Neck supple.  Cardiovascular: Normal rate and regular rhythm.   Respiratory: Effort normal and breath sounds normal.  GI: Soft. Bowel sounds are normal.  Musculoskeletal:       Right ankle: He exhibits decreased range of motion, swelling, ecchymosis and deformity. Tenderness. Lateral malleolus and medial malleolus tenderness found.  Neurological: He is alert and oriented to person, place, and time.  Skin: Skin is warm and dry.  Psychiatric: He has a normal mood and affect.     Assessment/Plan Post-right ankle crush injury with bimalleolar fracture 1)  To the OR today for open reduction/internal fixation of his complex right ankle fracture followed by admission for pain control, IV antibiotics, and therapy.  Risks and benefits have been discussed in  detail and informed consent obtained.  Mcarthur Rossetti, MD 04/15/2016, 7:14 AM

## 2016-04-15 NOTE — Evaluation (Addendum)
Physical Therapy Evaluation Patient Details Name: [name] Floyd MRN: [id number] DOB: 06/30/1976 Today's Date: 04/15/2016   History of Present Illness  Pt is a 40 y/o male s/p ORIF R ankle fx secondary to crushing work-related injury. No pertinent PMH.  Clinical Impression  Pt presented supine in bed with HOB elevated, R LE elevated, awake and willing to participate in therapy session. Prior to admission, pt reported using bilateral axillary crutches to ambulate while maintaining NWB R LE since his injury. Pt participated in bed mobility, transfers and gait training; however, pt declining stair training at this time as he reported that he feels comfortable with ascending and descending stairs while maintaining NWB R LE. Pt would continue to benefit from skilled physical therapy services at this time while admitted and after d/c to address his below listed limitations in order to improve his overall safety and independence with functional mobility.     Follow Up Recommendations Supervision for mobility/OOB;Home health PT    Equipment Recommendations  Rolling [place] with 5" wheels    Recommendations for Other Services       Precautions / Restrictions Precautions Precautions: Fall Restrictions Weight Bearing Restrictions: Yes RLE Weight Bearing: Non weight bearing      Mobility  Bed Mobility Overal bed mobility: Needs Assistance Bed Mobility: Supine to Sit;Sit to Supine     Supine to sit: Supervision;HOB elevated Sit to supine: Supervision   General bed mobility comments: pt required increased time  Transfers Overall transfer level: Needs assistance Equipment used: Rolling Darr (2 wheeled) Transfers: Sit to/from Stand Sit to Stand: Supervision         General transfer comment: pt required increased time to complete  Ambulation/Gait Ambulation/Gait assistance: Supervision Ambulation Distance (Feet): 150 Feet (150 ft x2 with sitting rest break in between) Assistive  device: Rolling Stork (2 wheeled) Gait Pattern/deviations: Step-to pattern (hop-to pattern to maintain NWB R LE ) Gait velocity: decreased Gait velocity interpretation: Below normal speed for age/gender    Stairs            Wheelchair Mobility    Modified Rankin (Stroke Patients Only)       Balance Overall balance assessment: Needs assistance Sitting-balance support: Feet supported;No upper extremity supported Sitting balance-Leahy Scale: Good     Standing balance support: During functional activity;No upper extremity supported Standing balance-Leahy Scale: Fair                               Pertinent Vitals/Pain Pain Assessment: No/denies pain    Home Living Family/patient expects to be discharged to:: Private residence Living Arrangements: Parent Available Help at Discharge: Family;Available PRN/intermittently Type of Home: House Home Access: Stairs to enter Entrance Stairs-Rails: Can reach both Entrance Stairs-Number of Steps: 2 Home Layout: One level Home Equipment: Crutches      Prior Function Level of Independence: Independent with assistive device(s)         Comments: pt has been using crutches to ambulate since injury     Hand Dominance        Extremity/Trunk Assessment   Upper Extremity Assessment: Overall WFL for tasks assessed           Lower Extremity Assessment: RLE deficits/detail RLE Deficits / Details: Pt unable to flex/extend toes and has no sensation in toes. Pt was able to flex at hip to raise his R LE against gravity.    Cervical / Trunk Assessment: Normal  Communication  Communication: No difficulties  Cognition Arousal/Alertness: Awake/alert Behavior During Therapy: WFL for tasks assessed/performed Overall Cognitive Status: Within Functional Limits for tasks assessed                      General Comments      Exercises        Assessment/Plan    PT Assessment Patient needs continued  PT services  PT Diagnosis Difficulty walking   PT Problem List Decreased strength;Decreased range of motion;Decreased activity tolerance;Decreased balance;Decreased mobility;Decreased coordination;Decreased knowledge of use of DME  PT Treatment Interventions DME instruction;Gait training;Stair training;Functional mobility training;Therapeutic activities;Therapeutic exercise;Balance training;Neuromuscular re-education;Patient/family education   PT Goals (Current goals can be found in the Care Plan section) Acute Rehab PT Goals Patient Stated Goal: to return home PT Goal Formulation: With patient Time For Goal Achievement: 04/29/16 Potential to Achieve Goals: Good    Frequency Min 5X/week   Barriers to discharge        Co-evaluation               End of Session Equipment Utilized During Treatment: Gait belt Activity Tolerance: Patient limited by fatigue Patient left: in bed;with call bell/phone within reach;with family/visitor present Nurse Communication: Mobility status         Time: 1610-9604 PT Time Calculation (min) (ACUTE ONLY): 35 min   Charges:   PT Evaluation $PT Eval Low Complexity: 1 Procedure PT Treatments $Gait Training: 8-22 mins   PT G CodesAlessandra Bevels Lachrisha Ziebarth 04/15/2016, 4:57 PM 04/15/2016 Deborah Chalk, PT, DPT [phone]

## 2016-04-15 NOTE — Anesthesia Procedure Notes (Signed)
Procedure Name: LMA Insertion Date/Time: 04/15/2016 7:39 AM Performed by: Doyce LooseHOFFMAN, Jossalyn Forgione ANN Pre-anesthesia Checklist: Patient identified, Emergency Drugs available, Suction available and Patient being monitored Patient Re-evaluated:Patient Re-evaluated prior to inductionOxygen Delivery Method: Circle System Utilized Preoxygenation: Pre-oxygenation with 100% oxygen Intubation Type: IV induction Ventilation: Mask ventilation without difficulty LMA: LMA inserted LMA Size: 4.0 Number of attempts: 1 Airway Equipment and Method: Bite block Placement Confirmation: positive ETCO2 Tube secured with: Tape Dental Injury: Teeth and Oropharynx as per pre-operative assessment

## 2016-04-15 NOTE — Anesthesia Procedure Notes (Signed)
Anesthesia Regional Block:  Popliteal block  Pre-Anesthetic Checklist: ,, timeout performed, Correct Patient, Correct Site, Correct Laterality, Correct Procedure, Correct Position, site marked, Risks and benefits discussed,  Surgical consent,  Pre-op evaluation,  At surgeon's request and post-op pain management  Laterality: Right  Prep: chloraprep       Needles:  Injection technique: Single-shot  Needle Type: Echogenic Needle     Needle Length: 9cm 9 cm Needle Gauge: 21 and 21 G    Additional Needles:  Procedures: ultrasound guided (picture in chart) Popliteal block Narrative:  Injection made incrementally with aspirations every 5 mL.  Performed by: Personally  Anesthesiologist: Cristin Szatkowski EDWARD  Additional Notes: No pain on injection. No increased resistance to injection. Injection made in 5cc increments.  Good needle visualization.  Patient tolerated procedure well.  Combined popliteal/saphenous nerve block.      

## 2016-04-15 NOTE — Op Note (Signed)
[name], [name]                ACCOUNT NO.:  [id number]  MEDICAL RECORD NO.:  [id number]  LOCATION:  [facility]                         FACILITY:  [facility]  PHYSICIAN:  Vanita PandaChristopher Y. Magnus IvanBlackman, M.D.DATE OF BIRTH:  [birth year]  DATE OF PROCEDURE:  04/15/2016 DATE OF DISCHARGE:                              OPERATIVE REPORT   PREOPERATIVE DIAGNOSIS:  Right ankle crush injury with bimalleolar fracture.  POSTOPERATIVE DIAGNOSIS:  Right ankle crush injury with bimalleolar fracture.  PROCEDURE:  Open reduction and internal fixation of right ankle bimalleolar fracture.  IMPLANTS:  Stryker VariAx 3-hole distal fibular plate laterally and a 4- hole 1/3 semi-tubular plate medially.  SURGEON:  Vanita PandaChristopher Y. Magnus IvanBlackman, M.D.  ASSISTANT:  Richardean CanalGilbert Clark, PA-C.  ANESTHESIA: 1. Regional right lower extremity block. 2. General.  ANTIBIOTICS:  2 g of IV Ancef.  BLOOD LOSS:  Minimal.  TOURNIQUET TIME:  Less than an hour and a half.  COMPLICATIONS:  None.  INDICATIONS:  [name] is a 40 year old gentleman who was involved in a work-related accident recently where he sustained a crushing injury to his right ankle and his right foot.  I then obtained a CT scan of the foot.  He has multiple fractures in multiple bones of the foot but they are all aligned well and would not need a surgical intervention.  His right ankle had a crushing injury with a bimalleolar ankle fracture.  He had significant fracture blistering and we have had to wait for the soft tissue to come down before we proceeded with surgery on the ankle.  The risks and benefits of this at this point have been explained to him in detail.  His soft tissue has got to the point where this will accommodate surgery.  The risks and benefits of the surgery were explained to him in detail and he does wish to proceed.  PROCEDURE DESCRIPTION:  After informed consent was obtained, appropriate right ankle was marked.  Anesthesia obtained a  regional anesthesia.  He was then brought to the operating room, placed supine on the operating table.  General anesthesia was then obtained.  A bump was placed under his right hip to internally rotate his leg.  A nonsterile tourniquet was placed around his upper right leg and his right leg was prepped and draped from the knee down to the toes with Betadine scrub and paint.  A time-out was called and he was identified as the correct patient and correct right ankle.  I then used an Esmarch to wrap out the leg and tourniquet was inflated to 300 mm of pressure.  I then made a lateral incision over the fibula and carried this proximally and distally.  We dissected down to the fracture itself and found a transverse fracture. We were able to temporarily hold it in place with a K-wire and then placed a Stryker VariAx 3-hole distal fibular plate along the lateral cortex of the fibula/lateral malleolus.  We secured this with bicortical screws proximally and locking screws distally.  We then went to the medial side.  The medial side was a vertical shear and so this needed a 1/3 semi- tubular plate.  We exposed the fracture on the medial  side and was able to place our 4-hole 1/3 semi-tubular plate along the medial cortex of the distal tibia securing this with 1 cancellous screw distally and 2 cortical screws proximally.  I then stressed the ankle mortise under direct fluoroscopy and it was reduced and stable.  We then irrigated the 2 wounds with normal saline solution using bulb syringe. We closed the deep tissue over the hardware on each side with 0 Vicryl, followed by 2-0 Vicryl in subcutaneous tissue, and interrupted 2-0 nylon on the skin.  Xeroform and well-padded sterile dressing was applied.  He was awakened, extubated and taken to the recovery room in stable condition.  All final counts were correct.  There were no complications noted.  Of note, Richardean Canal, PA-C assisted in the entire case.   His assistance was crucial for helping and facilitating all aspects of this case.     Vanita Panda. Magnus Ivan, M.D.     CYB/MEDQ  D:  04/15/2016  T:  04/15/2016  Job:  [id number]

## 2016-04-15 NOTE — Anesthesia Postprocedure Evaluation (Signed)
Anesthesia Post Note  Patient: [name] Floyd  Procedure(s) Performed: Procedure(s) (LRB): OPEN REDUCTION INTERNAL FIXATION (ORIF) RIGHT BIMALLEOLAR ANKLE FRACTURE (Right)  Patient location during evaluation: PACU Anesthesia Type: General and Regional Level of consciousness: awake and alert Pain management: pain level controlled Vital Signs Assessment: post-procedure vital signs reviewed and stable Respiratory status: spontaneous breathing, nonlabored ventilation, respiratory function stable and patient connected to nasal cannula oxygen Cardiovascular status: blood pressure returned to baseline and stable Postop Assessment: no signs of nausea or vomiting Anesthetic complications: no    Last Vitals:  Vitals:   04/15/16 1215 04/15/16 1300  BP: 113/71   Pulse: 67 83  Resp: 16   Temp:  36.6 C    Last Pain:  Vitals:   04/15/16 1300  TempSrc:   PainSc: 0-No pain                 Cecile HearingStephen Edward Delayna Sparlin

## 2016-04-16 DIAGNOSIS — S82841A Displaced bimalleolar fracture of right lower leg, initial encounter for closed fracture: Secondary | ICD-10-CM | POA: Diagnosis not present

## 2016-04-16 NOTE — Progress Notes (Signed)
Pt ready for discharge home with mother to accompany. No distress noted. All discharge instructions and rx given to pt. Personal belongings with pt. Pt reminded to keep follow up appts and adhere to PT recommendations.

## 2016-04-16 NOTE — Care Management Note (Signed)
Case Management Note  Patient Details  Name: [name] Floyd MRN: [id number] Date of Birth: August 19, 1975  Subjective/Objective:  S/p ORIF of fracture of ankle                  Action/Plan: Discharge Planning: AVS reviewed:   NCM spoke to pt and mother, [name] [phone] at bedside. Pt states his claims Gwinda Maineadjuster, [name] Malone [phone] fax [phone]. Faxed dc summary, op note, facesheet, and HH/DME orders. Spoke to Engineering geologistclaims adjuster and approval given to arrange DME and initial HH. Contacted [facility] DME rep for RW, 3n1 and tub bench for home. Pt states he has Crutches. [facility]. Mother will be at home to assist pt with his care.     Expected Discharge Date:  04/16/2016              Expected Discharge Plan:  Home w Home Health Services  In-House Referral:  NA  Discharge planning Services  CM Consult  Post Acute Care Choice:  Home Health Choice offered to:  Patient  DME Arranged:  3-N-1, Tub bench, Overacker rolling DME Agency:  [facility]  [facility]  Status of Service:  Completed, signed off  If discussed at Long Length of Stay Meetings, dates discussed:    Additional Comments:  Elliot CousinShavis, Fortune Torosian Ellen, RN 04/16/2016, 11:11 AM

## 2016-04-16 NOTE — Progress Notes (Signed)
Orthopedic Tech Progress Note Patient Details:  [name] MeigsBarry K Floyd 1976/06/14 [id number]  Ortho Devices Type of Ortho Device: CAM Mandler Ortho Device/Splint Interventions: Application   [name] FordyceJennifer C Auna Floyd 04/16/2016, 11:51 AM

## 2016-04-16 NOTE — Progress Notes (Signed)
Subjective: 1 Day Post-Op Procedure(s) (LRB): OPEN REDUCTION INTERNAL FIXATION (ORIF) RIGHT BIMALLEOLAR ANKLE FRACTURE (Right) Patient reports pain as moderate.    Objective: Vital signs in last 24 hours: Temp:  [97.3 F (36.3 C)-98.8 F (37.1 C)] 98.5 F (36.9 C) (09/15 0605) Pulse Rate:  [58-96] 75 (09/15 0605) Resp:  [13-30] 16 (09/15 0605) BP: (103-133)/(69-86) 133/84 (09/15 0605) SpO2:  [95 %-100 %] 100 % (09/15 0605)  Intake/Output from previous day: 09/14 0701 - 09/15 0700 In: 1240 [P.O.:240; I.V.:1000] Out: 1100 [Urine:1050; Blood:50] Intake/Output this shift: Total I/O In: -  Out: 500 [Urine:500]   Recent Labs  04/13/16 1456  HGB 12.3*    Recent Labs  04/13/16 1456  WBC 5.8  RBC 3.97*  HCT 37.8*  PLT 326    Recent Labs  04/13/16 1456  NA 138  K 4.0  CL 105  CO2 25  BUN 11  CREATININE 0.80  GLUCOSE 104*  CALCIUM 9.5   No results for input(s): LABPT, INR in the last 72 hours.  Sensation intact distally Intact pulses distally Dorsiflexion/Plantar flexion intact Incision: dressing C/D/I Compartment soft  Assessment/Plan: 1 Day Post-Op Procedure(s) (LRB): OPEN REDUCTION INTERNAL FIXATION (ORIF) RIGHT BIMALLEOLAR ANKLE FRACTURE (Right) Up with therapy  Discharge to home today.  Kathryne HitchChristopher Y Axle Parfait 04/16/2016, 6:15 AM

## 2016-04-16 NOTE — Progress Notes (Signed)
Physical Therapy Treatment Patient Details Name: [name] MeigsBarry K Floyd MRN: [id number] DOB: May 08, 1976 Today's Date: 04/16/2016    History of Present Illness Pt is a 40 y/o male s/p ORIF R ankle fx secondary to crushing work-related injury. No pertinent PMH.    PT Comments    Pt presented supine in bed with HOB elevated, awake and willing to participate in therapy session. Pt making good progress towards achieving his functional goals. Pt demonstrates safe technique with RW during ambulation. PT reviewed an HEP handout with pt and gave the pt a theraband to take home. Pt would continue to benefit from skilled physical therapy services at this time while admitted and after d/c to address his limitations in order to improve his overall safety and independence with functional mobility.   Follow Up Recommendations  Supervision for mobility/OOB;Home health PT     Equipment Recommendations  Rolling [place] with 5" wheels    Recommendations for Other Services       Precautions / Restrictions Precautions Precautions: Fall Restrictions Weight Bearing Restrictions: Yes RLE Weight Bearing: Non weight bearing    Mobility  Bed Mobility Overal bed mobility: Needs Assistance Bed Mobility: Supine to Sit;Sit to Supine     Supine to sit: Supervision;HOB elevated Sit to supine: Supervision   General bed mobility comments: pt required increased time  Transfers Overall transfer level: Needs assistance Equipment used: Rolling [place] (2 wheeled) Transfers: Sit to/from Stand Sit to Stand: Supervision         General transfer comment: pt required increased time to complete  Ambulation/Gait Ambulation/Gait assistance: Supervision Ambulation Distance (Feet): 150 Feet (150 ft x2 with sitting rest break in between) Assistive device: Rolling [place] (2 wheeled) Gait Pattern/deviations: Step-to pattern (hop-to pattern on L LE to maintain NWB R LE) Gait velocity: decreased Gait velocity  interpretation: Below normal speed for age/gender General Gait Details: pt able to maintain NWB R LE throughout gait independently   Stairs            Wheelchair Mobility    Modified Rankin (Stroke Patients Only)       Balance Overall balance assessment: Needs assistance Sitting-balance support: Feet supported;No upper extremity supported Sitting balance-Leahy Scale: Good     Standing balance support: During functional activity;Single extremity supported Standing balance-Leahy Scale: Poor                      Cognition Arousal/Alertness: Awake/alert Behavior During Therapy: WFL for tasks assessed/performed Overall Cognitive Status: Within Functional Limits for tasks assessed                      Exercises      General Comments        Pertinent Vitals/Pain Pain Assessment: Faces Faces Pain Scale: Hurts a little bit Pain Location: R ankle Pain Descriptors / Indicators: Guarding Pain Intervention(s): Monitored during session;Repositioned    Home Living                      Prior Function            PT Goals (current goals can now be found in the care plan section) Acute Rehab PT Goals Patient Stated Goal: return home today PT Goal Formulation: With patient Time For Goal Achievement: 04/29/16 Potential to Achieve Goals: Good Progress towards PT goals: Progressing toward goals    Frequency   PT Diagnosis  Min 5X/week       PT Plan Current plan  remains appropriate    Co-evaluation             End of Session Equipment Utilized During Treatment: Gait belt Activity Tolerance: Patient limited by fatigue Patient left: in bed;with call bell/phone within reach;with family/visitor present     Time: 1000-1036 PT Time Calculation (min) (ACUTE ONLY): 36 min  Charges:  $Gait Training: 23-37 mins                    G Codes:  Functional Assessment Tool Used: clinical judgement Functional Limitation: Mobility: Walking  and moving around Mobility: Walking and Moving Around Current Status [phone]): At least 1 percent but less than 20 percent impaired, limited or restricted Mobility: Walking and Moving Around Goal Status [phone]): 0 percent impaired, limited or restricted   [facility] 04/16/2016, 11:26 AM Deborah Chalk, PT, DPT [phone]

## 2016-04-16 NOTE — Discharge Summary (Signed)
Patient ID: [name] Floyd MRN: [id number] DOB/AGE: Jun 30, 1976 40 y.o.  Admit date: 04/15/2016 Discharge date: 04/16/2016  Admission Diagnoses:  Principal Problem:   Closed bimalleolar fracture of right ankle Active Problems:   Status post ORIF of fracture of ankle   Discharge Diagnoses:  Same  Past Medical History:  Diagnosis Date  . Fracture of ankle    rt    Surgeries: Procedure(s): OPEN REDUCTION INTERNAL FIXATION (ORIF) RIGHT BIMALLEOLAR ANKLE FRACTURE on 04/15/2016   Consultants:   Discharged Condition: Improved  Hospital Course: [name] MeigsBarry K Parma is an 40 y.o. male who was admitted 04/15/2016 for operative treatment ofClosed bimalleolar fracture of right ankle. Patient has severe unremitting pain that affects sleep, daily activities, and work/hobbies. After pre-op clearance the patient was taken to the operating room on 04/15/2016 and underwent  Procedure(s): OPEN REDUCTION INTERNAL FIXATION (ORIF) RIGHT BIMALLEOLAR ANKLE FRACTURE.    Patient was given perioperative antibiotics: Anti-infectives    Start     Dose/Rate Route Frequency Ordered Stop   04/15/16 1330  ceFAZolin (ANCEF) IVPB 1 g/50 mL premix     1 g 100 mL/hr over 30 Minutes Intravenous Every 6 hours 04/15/16 1320 04/16/16 0252   04/15/16 0520  ceFAZolin (ANCEF) IVPB 2g/100 mL premix     2 g 200 mL/hr over 30 Minutes Intravenous On call to O.R. 04/15/16 0520 04/15/16 0740       Patient was given sequential compression devices, early ambulation, and chemoprophylaxis to prevent DVT.  Patient benefited maximally from hospital stay and there were no complications.    Recent vital signs: Patient Vitals for the past 24 hrs:  BP Temp Temp src Pulse Resp SpO2  04/16/16 0605 133/84 98.5 F (36.9 C) Oral 75 16 100 %  04/16/16 0100 111/69 98.8 F (37.1 C) Oral 66 16 97 %  04/15/16 2144 103/70 98.5 F (36.9 C) Oral 67 16 98 %  04/15/16 1342 115/78 - - 72 17 100 %  04/15/16 1300 - 97.9 F (36.6 C) - 83 - 100 %   04/15/16 1215 113/71 - - 67 16 100 %  04/15/16 1212 - - - 65 16 100 %  04/15/16 1200 - - - (!) 58 16 100 %  04/15/16 1148 - - - 73 (!) 25 100 %  04/15/16 1136 - - - 81 (!) 22 100 %  04/15/16 1135 114/77 - - 76 13 100 %  04/15/16 1124 - - - 71 17 100 %  04/15/16 1112 - - - 65 17 100 %  04/15/16 1105 106/77 - - 60 17 100 %  04/15/16 1100 - - - 62 18 100 %  04/15/16 1048 - - - 63 16 100 %  04/15/16 1036 - - - 65 19 100 %  04/15/16 1035 110/80 - - 70 20 100 %  04/15/16 1024 - - - 65 13 100 %  04/15/16 1012 - - - 76 16 100 %  04/15/16 1000 - - - 73 16 97 %  04/15/16 0950 115/86 - - 79 17 98 %  04/15/16 0948 - - - - 16 -  04/15/16 0935 121/82 - - 88 16 99 %  04/15/16 0920 116/81 - - 84 18 95 %  04/15/16 0905 120/82 97.3 F (36.3 C) - 96 20 100 %  04/15/16 0723 - - - - (!) 30 -  04/15/16 0722 - - - 84 15 100 %  04/15/16 0721 - - - 88 17 100 %  04/15/16 0720 - - -  89 20 100 %  04/15/16 0719 - - - 80 (!) 22 98 %  04/15/16 0718 - - - 78 14 100 %  04/15/16 0717 - - - 93 18 100 %  04/15/16 0716 - - - 78 18 100 %  04/15/16 0715 - - - 82 17 100 %  04/15/16 0714 - - - 87 18 100 %  04/15/16 0713 - - - 86 (!) 28 100 %     Recent laboratory studies:  Recent Labs  04/13/16 1456  WBC 5.8  HGB 12.3*  HCT 37.8*  PLT 326  NA 138  K 4.0  CL 105  CO2 25  BUN 11  CREATININE 0.80  GLUCOSE 104*  CALCIUM 9.5     Discharge Medications:     Medication List    STOP taking these medications   HYDROcodone-acetaminophen 5-325 MG tablet Commonly known as:  NORCO/VICODIN     TAKE these medications   acetaminophen 500 MG tablet Commonly known as:  TYLENOL Take 1,000 mg by mouth every 6 (six) hours as needed for mild pain.   ibuprofen 200 MG tablet Commonly known as:  ADVIL,MOTRIN Take 800 mg by mouth every 6 (six) hours as needed for moderate pain.   naproxen 500 MG tablet Commonly known as:  NAPROSYN Take 1 tablet (500 mg total) by mouth 2 (two) times daily.    oxyCODONE-acetaminophen 5-325 MG tablet Commonly known as:  ROXICET Take 1-2 tablets by mouth every 4 (four) hours as needed.       Diagnostic Studies: Dg Ankle Complete Right  Result Date: 04/15/2016 CLINICAL DATA:  Post- operative ORIF of bimalleolar right ankle fracture. EXAM: RIGHT ANKLE - COMPLETE 3+ VIEW; DG C-ARM 61-120 MIN COMPARISON:  Right ankle series of April 05, 2016 FINDINGS: Three fluoro spot images are reviewed. Reported fluoro time is 31 seconds. The patient has undergone plate and screw reduction and fixation of a bimalleolar fracture. The alignment of the fracture fragments is now near anatomic. The ankle joint mortise is near anatomic as well. IMPRESSION: No immediate postprocedure complication following spur plate and screw fixation of a bimalleolar fracture of the right ankle. Electronically Signed   By: David  [place] M.D.   On: 04/15/2016 08:52   Dg Ankle Complete Right  Result Date: 04/05/2016 CLINICAL DATA:  Crush injury to right foot and ankle. Initial encounter. EXAM: RIGHT ANKLE - COMPLETE 3+ VIEW COMPARISON:  None. FINDINGS: Mildly displaced bimalleolar fracture is seen involving the lateral and medial malleoli. Overlying soft tissue swelling present. The talar dome appears intact. No visible posterior malleolar fracture. IMPRESSION: Mild displaced bimalleolar fracture involving both medial and lateral malleoli of the right ankle. Electronically Signed   By: Irish Lack M.D.   On: 04/05/2016 14:02   Ct Foot Right Wo Contrast  Result Date: 04/14/2016 CLINICAL DATA:  Crush injury of the right foot. EXAM: CT OF THE RIGHT FOOT WITHOUT CONTRAST TECHNIQUE: Multidetector CT imaging of the right foot was performed according to the standard protocol. Multiplanar CT image reconstructions were also generated. COMPARISON:  Radiographs 04/05/2016 FINDINGS: There is a severely comminuted displaced fracture involving the fifth metatarsal shaft and base. Comminuted fracture of  the cuboid with intra-articular involvement at the calcaneocuboid joint and a maximum of 4.5 mm of separation at the joint. The other metatarsal bones are intact. There are tiny fractures at the tarsal metatarsal joints suspicious for ligamentous able shins. Comminuted fractures of the middle and lateral cuneiforms. Small avulsion fracture noted in the  region of Lisfranc ligament attachment at the base of the second metatarsal. Mildly displaced fracture involving the lateral aspect of the navicular bone with mild comminution. Maximum displacement is 5 mm. No fracture of the talus is identified. The calcaneus is intact. The calcaneal facets are maintained. The talonavicular joint is maintained. Largely transverse fracture through the distal fibular shaft just above the level of the ankle mortise. There is also a vertical shear type intra-articular fracture involving the distal tibia extending to the base of the medial malleolus. A small avulsion fractures also noted along the lateral margin of the tibia, likely an avulsion injury from the interosseous ligament. IMPRESSION: 1. Severely comminuted displaced fractures of the fifth metatarsal shaft and base. 2. Comminuted fractures of the cuboid, lateral and middle cuneiforms. 3. Mildly displaced fracture involving the lateral aspect of the navicular bone. 4. Findings suspicious for a Lisfranc ligament injury with small avulsion fractures near its attachment site. Other ligamentous injuries are suspected given the small avulsion fractures at the tarsal metatarsal joints. 5. Fractures of the distal fibula and tibia as discussed above. Electronically Signed   By: Rudie Meyer M.D.   On: 04/14/2016 17:30   Dg Foot Complete Right  Result Date: 04/05/2016 CLINICAL DATA:  Crush injury to right foot and ankle. Initial encounter. EXAM: RIGHT FOOT COMPLETE - 3+ VIEW COMPARISON:  None. FINDINGS: There is a comminuted fracture of the fifth metatarsal demonstrating displacement  of multiple fracture fragments. Avulsive fracture also noted on the lateral view involving the plantar aspect of the cuboid. Fracture planes are suspected to extend into the cuboid bone and the cuboid bone is also mildly displaced in appearance. There is also likely relatively nondisplaced fracture at the level of the proximal fourth metatarsal. IMPRESSION: 1. Comminuted fracture of the fifth metatarsal with displacement of multiple fracture fragments. 2. Avulsive injury involving the plantar aspect of the cuboid. Other fracture planes also may extend into the cuboid itself and the cuboid appears rotated. 3. Probable nondisplaced/minimally displaced fracture at the base of the fourth metatarsal. Electronically Signed   By: Irish Lack M.D.   On: 04/05/2016 14:06   Dg C-arm 1-60 Min  Result Date: 04/15/2016 CLINICAL DATA:  Post- operative ORIF of bimalleolar right ankle fracture. EXAM: RIGHT ANKLE - COMPLETE 3+ VIEW; DG C-ARM 61-120 MIN COMPARISON:  Right ankle series of April 05, 2016 FINDINGS: Three fluoro spot images are reviewed. Reported fluoro time is 31 seconds. The patient has undergone plate and screw reduction and fixation of a bimalleolar fracture. The alignment of the fracture fragments is now near anatomic. The ankle joint mortise is near anatomic as well. IMPRESSION: No immediate postprocedure complication following spur plate and screw fixation of a bimalleolar fracture of the right ankle. Electronically Signed   By: David  [place] M.D.   On: 04/15/2016 08:52    Disposition: 01-Home or Self Care  Discharge Instructions    Call MD / Call 911    Complete by:  As directed    If you experience chest pain or shortness of breath, CALL 911 and be transported to the hospital emergency room.  If you develope a fever above 101 F, pus (white drainage) or increased drainage or redness at the wound, or calf pain, call your surgeon's office.   Constipation Prevention    Complete by:  As directed     Drink plenty of fluids.  Prune juice may be helpful.  You may use a stool softener, such as Colace (over the counter)  100 mg twice a day.  Use MiraLax (over the counter) for constipation as needed.   Diet - low sodium heart healthy    Complete by:  As directed    Discharge patient    Complete by:  As directed    Increase activity slowly as tolerated    Complete by:  As directed       Follow-up Information    Kathryne Hitch, MD Follow up in 2 week(s).   Specialty:  Orthopedic Surgery Contact information: [address] [place] [address] [phone]            Signed: Kathryne Hitch 04/16/2016, 6:16 AM

## 2016-04-19 ENCOUNTER — Encounter (HOSPITAL_COMMUNITY): Payer: Self-pay | Admitting: Orthopaedic Surgery

## 2016-05-27 ENCOUNTER — Ambulatory Visit (INDEPENDENT_AMBULATORY_CARE_PROVIDER_SITE_OTHER): Payer: Self-pay

## 2016-05-27 ENCOUNTER — Ambulatory Visit (INDEPENDENT_AMBULATORY_CARE_PROVIDER_SITE_OTHER): Payer: Self-pay | Admitting: Orthopaedic Surgery

## 2016-05-27 ENCOUNTER — Ambulatory Visit (INDEPENDENT_AMBULATORY_CARE_PROVIDER_SITE_OTHER): Payer: Worker's Compensation

## 2016-05-27 DIAGNOSIS — S82841D Displaced bimalleolar fracture of right lower leg, subsequent encounter for closed fracture with routine healing: Secondary | ICD-10-CM

## 2016-05-27 NOTE — Progress Notes (Signed)
   Office Visit Note   Patient: [name] Floyd           Date of Birth: 02/17/1976           MRN: [id number] Visit Date: 05/27/2016              Requested by: No referring provider defined for this encounter. PCP: No PCP Per Patient   Assessment & Plan: Visit Diagnoses:  1. Closed displaced bimalleolar fracture of right lower leg with routine healing     Plan: At this point I will let him attempt full weightbearing in his cam Wecker boot. This is weightbearing as tolerated so if it's too painful for him he knows to back off. I showed him range of motion exercises to try. I would like to see him back in 4 weeks with a repeat AP and mortise or 2 views of his right ankle and 3 views of his right foot continue to remain out of work. In 4 weeks from now we may consider sending him to physical therapy for range of motion of the ankle as well as strengthening  Follow-Up Instructions: Return in about 4 weeks (around 06/24/2016).   Orders:  Orders Placed This Encounter  Procedures  . XR Ankle Complete Right  . XR Foot 2 Views Right   No orders of the defined types were placed in this encounter.     Procedures: No procedures performed   Clinical Data: No additional findings.   Subjective: Chief Complaint  Patient presents with  . Right Ankle - Routine Post Op    Rt ORIF Ankle 04/15/16, patient ambulates with crutches and cam boot. Doing well.    HPI  Review of Systems   Objective: Vital Signs: There were no vitals taken for this visit.  Physical Exam  Ortho Exam He has decreased swelling but still swelling of the right ankle and right foot. His incisions are healing nicely. He does have some areas where the sutures are "spitting out" Specialty Comments:  No specialty comments available.  Imaging: Xr Ankle Complete Right  Result Date: 05/27/2016 X-rays of his right ankle show well located ankle with intact hardware and interval healing of his fractures  Xr Foot 2  Views Right  Result Date: 05/27/2016 2 views of his right foot showed that his multiple metatarsal fractures and midfoot fractures show interval healing and osteopenia secondary to nonweightbearing    PMFS History: Patient Active Problem List   Diagnosis Date Noted  . Closed bimalleolar fracture of right ankle 04/15/2016  . Status post ORIF of fracture of ankle 04/15/2016   Past Medical History:  Diagnosis Date  . Fracture of ankle    rt    No family history on file.  Past Surgical History:  Procedure Laterality Date  . NO PAST SURGERIES    . ORIF ANKLE FRACTURE Right 04/15/2016   Procedure: OPEN REDUCTION INTERNAL FIXATION (ORIF) RIGHT BIMALLEOLAR ANKLE FRACTURE;  Surgeon: Kathryne Hitchhristopher Y Blackman, MD;  Location: [facility] OR;  Service: Orthopedics;  Laterality: Right;  . ORIF ANKLE FRACTURE BIMALLEOLAR Right 04/15/2016   Social History   Occupational History  . Not on file.   Social History Main Topics  . Smoking status: Current Every Day Smoker    Packs/day: 0.50    Years: 18.00    Types: Cigarettes  . Smokeless tobacco: Never Used  . Alcohol use No  . Drug use: No  . Sexual activity: Not on file

## 2016-05-31 ENCOUNTER — Telehealth (INDEPENDENT_AMBULATORY_CARE_PROVIDER_SITE_OTHER): Payer: Self-pay | Admitting: Orthopaedic Surgery

## 2016-06-23 ENCOUNTER — Ambulatory Visit (INDEPENDENT_AMBULATORY_CARE_PROVIDER_SITE_OTHER): Payer: Self-pay

## 2016-06-23 ENCOUNTER — Ambulatory Visit (INDEPENDENT_AMBULATORY_CARE_PROVIDER_SITE_OTHER): Payer: Worker's Compensation | Admitting: Orthopaedic Surgery

## 2016-06-23 DIAGNOSIS — S82841D Displaced bimalleolar fracture of right lower leg, subsequent encounter for closed fracture with routine healing: Secondary | ICD-10-CM | POA: Diagnosis not present

## 2016-06-23 NOTE — Progress Notes (Signed)
Office Visit Note   Patient: [name] MeigsBarry K Floyd           Date of Birth: [birth year]           MRN: [id number] Visit Date: 06/23/2016              Requested by: No referring provider defined for this encounter. PCP: No PCP Per Patient   Assessment & Plan: Visit Diagnoses:  1. Closed displaced bimalleolar fracture of right lower leg with routine healing     Plan: His x-rays and improved dramatically. At this point I would like to initiate physical therapy to work on range of motion and strengthening of his right foot and ankle. We'll let him weight-bear as tolerated and transition from a Cam Zavalza to a lace up ankle brace and supportive shoes once he is ready to do so. He can go back and forth between these items. I will still need to keep him out of work completely until further notice. 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 I would like to see him back in 4 weeks to see how is doing overall but this time no x-rays will be needed.  Follow-Up Instructions: Return in about 4 weeks (around 07/21/2016).   Orders:  Orders Placed This Encounter  Procedures  . XR Ankle 2 Views Right  . XR Foot Complete Right   No orders of the defined types were placed in this encounter.     Procedures: No procedures performed   Clinical Data: No additional findings.   Subjective: Chief Complaint  Patient presents with  . Right Ankle - Follow-up    Patient states he is getting better each day. Weight bearing some. Ambulates with crutches and cam boot    HPI He is trying to put some weight on his foot and ankle but has been difficult to do so. Review of Systems He has no active medical problems otherwise.  Objective: Vital Signs: There were no vitals taken for this visit.  Physical Exam He is alert and oriented 3 Ortho Exam Examination of his right ankle and foot still shows  swelling of the ankle and foot but is much less. His plantarflexion dorsiflexion is also improving significantly. His incisions Specialty Comments:  No specialty comments available.  Imaging: Xr Ankle 2 Views Right  Result Date: 06/23/2016 2 views of his right ankle shows ankle mortise is intact. The hardware is intact. In the fractures almost completely healed.  Xr Foot Complete Right  Result Date: 06/23/2016 3 views of his right foot show interval healing of his midfoot fractures and fifth metatarsal fracture. The overall alignment looks good. There is abundant callus formation and the fractures almost completely.    PMFS History: Patient Active Problem List   Diagnosis Date Noted  . Closed bimalleolar fracture of right ankle 04/15/2016  . Status post ORIF of fracture of ankle 04/15/2016   Past Medical History:  Diagnosis Date  . Fracture of ankle    rt    No family history on file.  Past Surgical History:  Procedure Laterality Date  . NO PAST SURGERIES    . ORIF ANKLE FRACTURE Right 04/15/2016   Procedure: OPEN REDUCTION INTERNAL FIXATION (ORIF) RIGHT BIMALLEOLAR ANKLE FRACTURE;  Surgeon: Kathryne Hitchhristopher Y Evens Meno, MD;  Location: [facility] OR;  Service: Orthopedics;  Laterality: Right;  . ORIF ANKLE FRACTURE BIMALLEOLAR Right 04/15/2016   Social  History   Occupational History  . Not on file.   Social History Main Topics  . Smoking status: Current Every Day Smoker    Packs/day: 0.50    Years: 18.00    Types: Cigarettes  . Smokeless tobacco: Never Used  . Alcohol use No  . Drug use: No  . Sexual activity: Not on file

## 2016-06-28 ENCOUNTER — Telehealth (INDEPENDENT_AMBULATORY_CARE_PROVIDER_SITE_OTHER): Payer: Self-pay | Admitting: Orthopaedic Surgery

## 2016-06-29 NOTE — Telephone Encounter (Signed)
faxed

## 2016-06-30 NOTE — Telephone Encounter (Signed)
Physical therapy and Hand specialist is requesting a script for this patient , if he has had surgery she needs a "PO" note as well  Fax#: [phone] Phone#: [phone]

## 2016-06-30 NOTE — Telephone Encounter (Signed)
Please advise 

## 2016-07-01 ENCOUNTER — Ambulatory Visit: Payer: Worker's Compensation | Admitting: Physical Therapy

## 2016-07-01 NOTE — Telephone Encounter (Signed)
I'll give you script to send

## 2016-07-02 NOTE — Telephone Encounter (Signed)
Faxed Rx

## 2016-07-21 ENCOUNTER — Ambulatory Visit (INDEPENDENT_AMBULATORY_CARE_PROVIDER_SITE_OTHER): Payer: Worker's Compensation | Admitting: Orthopaedic Surgery

## 2016-07-21 ENCOUNTER — Telehealth (INDEPENDENT_AMBULATORY_CARE_PROVIDER_SITE_OTHER): Payer: Self-pay | Admitting: *Deleted

## 2016-07-21 ENCOUNTER — Encounter (INDEPENDENT_AMBULATORY_CARE_PROVIDER_SITE_OTHER): Payer: Self-pay | Admitting: Orthopaedic Surgery

## 2016-07-21 DIAGNOSIS — S82841D Displaced bimalleolar fracture of right lower leg, subsequent encounter for closed fracture with routine healing: Secondary | ICD-10-CM

## 2016-07-21 NOTE — Progress Notes (Signed)
The patient is just over 3 months now status post a severe crush injury to his left ankle and left foot. The left ankle did require surgery. The left foot is required operative treatment but it did involve multiple bines within his foot. He's been to 6 physical therapy sessions now over the last 2 weeks. He still and visit with a crutch. He has pain putting weight on his right foot but he is able to put weight on it. He has not been comfortable feeling driving at all.  On examination of his right foot and his right ankle both are still swollen to be expected. Both incisions on the ankle is healed well. He has full plantarflexion of the ankle to about 5 past dorsiflexion of the right ankle. He has global swelling of his foot. The ligaments feel stable on stressing. I do not x-rays ankle today.  At this point he needs to continue physical therapy to work on edema reduction, range of motion and strengthening of the foot and ankle. Given that he is not comfortable driving yet I do not feel he is able to work yet either. 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 Given the weakness of his foot and ankle I have not cleared him to drive either.  We see him back in 4 weeks I would like 3 views of his right ankle and an AP and oblique of his right foot.

## 2016-07-21 NOTE — Telephone Encounter (Signed)
Faxed office visit note from 11/22 to number provided below.

## 2016-07-21 NOTE — Telephone Encounter (Signed)
[employer] [facility] called for 11/22 pt work status and office note FAX [phone].

## 2016-07-30 ENCOUNTER — Telehealth (INDEPENDENT_AMBULATORY_CARE_PROVIDER_SITE_OTHER): Payer: Self-pay | Admitting: Orthopaedic Surgery

## 2016-07-30 ENCOUNTER — Encounter (INDEPENDENT_AMBULATORY_CARE_PROVIDER_SITE_OTHER): Payer: Self-pay

## 2016-07-30 NOTE — Telephone Encounter (Signed)
Only sedintary, sit down at all times work.  No walking for long distances or standing

## 2016-07-30 NOTE — Telephone Encounter (Signed)
[employer] WC called for 12/20 pt work status and office note FAX [phone].

## 2016-07-30 NOTE — Telephone Encounter (Signed)
See below. In your note are you saying he can return to work, he just can't drive? Normal duty? Restricted duty?

## 2016-08-03 NOTE — Telephone Encounter (Signed)
Faxed to provided number  

## 2016-08-10 ENCOUNTER — Telehealth (INDEPENDENT_AMBULATORY_CARE_PROVIDER_SITE_OTHER): Payer: Self-pay | Admitting: Orthopaedic Surgery

## 2016-08-10 NOTE — Telephone Encounter (Signed)
Pt called and asked if we can send info for his phys therapy with [facility] to them. He said they faxed something over but we may have not gotten it.  Fax to [phone]

## 2016-08-17 NOTE — Telephone Encounter (Signed)
Noted. Will do so when we receive paperwork to sign

## 2016-08-19 ENCOUNTER — Ambulatory Visit (INDEPENDENT_AMBULATORY_CARE_PROVIDER_SITE_OTHER): Payer: Self-pay | Admitting: Orthopaedic Surgery

## 2016-08-23 ENCOUNTER — Ambulatory Visit (INDEPENDENT_AMBULATORY_CARE_PROVIDER_SITE_OTHER): Payer: Self-pay

## 2016-08-23 ENCOUNTER — Ambulatory Visit (INDEPENDENT_AMBULATORY_CARE_PROVIDER_SITE_OTHER): Payer: Worker's Compensation | Admitting: Orthopaedic Surgery

## 2016-08-23 DIAGNOSIS — Z967 Presence of other bone and tendon implants: Secondary | ICD-10-CM

## 2016-08-23 DIAGNOSIS — Z8781 Personal history of (healed) traumatic fracture: Secondary | ICD-10-CM

## 2016-08-23 DIAGNOSIS — Z9889 Other specified postprocedural states: Principal | ICD-10-CM

## 2016-08-23 NOTE — Progress Notes (Signed)
The patient is now just over 4 months out from crushing injuries to his right foot and ankle that were work-related. He is now walking without an assistive device and with a minimal limp. Therapy was never set up for him again recently but he is doing well. He feels like he is making great progress. His pain is mainly weather-related ORIF he's been on his feet all day.  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 He almost has full range of motion of his right ankle and foot at this point. There is residual swelling on the dorsal lateral aspect of his foot from the fracture itself and his neurovascular exam is improved significantly.  X-rays of the foot and ankle show that all the fractures of healed.  At this point I will allow him to return to work on 09/06/2016. For the first 6 weeks though to be allowed 15 minute rest periods 3 times a day of being off of his feet. I will send him for a final visit in 6 weeks to see how is doing overall but no x-rays are needed. Point we'll likely perform a disability rating.

## 2016-08-24 ENCOUNTER — Telehealth (INDEPENDENT_AMBULATORY_CARE_PROVIDER_SITE_OTHER): Payer: Self-pay | Admitting: Orthopaedic Surgery

## 2016-08-24 NOTE — Telephone Encounter (Signed)
Faxed to provided number  

## 2016-08-24 NOTE — Telephone Encounter (Signed)
[employer] adjuster requesting pt notes from visit yesterday with work status.   [phone] attn [employer]

## 2016-10-04 ENCOUNTER — Ambulatory Visit (INDEPENDENT_AMBULATORY_CARE_PROVIDER_SITE_OTHER): Payer: Worker's Compensation | Admitting: Orthopaedic Surgery

## 2016-10-04 DIAGNOSIS — Z8781 Personal history of (healed) traumatic fracture: Secondary | ICD-10-CM

## 2016-10-04 DIAGNOSIS — Z967 Presence of other bone and tendon implants: Secondary | ICD-10-CM | POA: Diagnosis not present

## 2016-10-04 DIAGNOSIS — Z9889 Other specified postprocedural states: Principal | ICD-10-CM

## 2016-10-04 NOTE — Progress Notes (Signed)
The patient is now 6 months status post open reduction and fixation of complex right ankle injury including fractures to his right foot. We only performed surgery on the ankle and not the foot to both had significant crush injuries and multiple fractures. He's been out of work for last 6 months but doing well as he start in 2 transition more significant activities and the idea of getting back to work soon.  On examination he has almost full plantarflexion of his ankle and he lacks full dorsiflexion by only about 3. Actually when compared to his other foot it's almost the same. 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 Both incisions well-healed there is no evidence infection the swelling is minimal. His sensation is slightly diminished but certainly improved. He has almost 5 out of 5 strength against forced resistance to dorsiflexion and plantarflexion. We are he had previous x-ray showing that the fractures healed completely.  At this point I released him to full work duty starting 10/18/2016. I have no restrictions for him. He understands that he will likely develop swelling from time to time he does have a high risk of protracted arthritis of his right ankle and right foot given the nature of these fractures in his trauma. A disability rating will be forthcoming. He's released follow-up as needed.

## 2016-10-07 ENCOUNTER — Telehealth (INDEPENDENT_AMBULATORY_CARE_PROVIDER_SITE_OTHER): Payer: Self-pay

## 2016-10-07 NOTE — Telephone Encounter (Signed)
Received voicemail requesting the 10/04/16 office and work note for this patient. Faxed to [phone]

## 2016-10-11 ENCOUNTER — Telehealth (INDEPENDENT_AMBULATORY_CARE_PROVIDER_SITE_OTHER): Payer: Self-pay | Admitting: *Deleted

## 2016-10-11 NOTE — Telephone Encounter (Signed)
Patient called in this morning in regards to wanting to know if he should keep his appointment coming up this week. He was just seen on the 5th of this month and he is not sure why this follow up was made? He would like a callback regarding this please. His CB # [phone]. Thank you

## 2016-10-12 NOTE — Telephone Encounter (Signed)
DONE

## 2016-10-12 NOTE — Telephone Encounter (Signed)
He does not need any follow-up

## 2016-10-12 NOTE — Telephone Encounter (Signed)
Please advise 

## 2016-10-12 NOTE — Telephone Encounter (Signed)
Can you cancel his appt this coming up week and block that spot please

## 2016-10-13 ENCOUNTER — Ambulatory Visit (INDEPENDENT_AMBULATORY_CARE_PROVIDER_SITE_OTHER): Payer: Self-pay | Admitting: Orthopaedic Surgery

## 2017-06-06 IMAGING — CR DG FOOT COMPLETE 3+V*R*
3 series · 3 of 3 positions shown · non-contrast
Comparison: None.

CLINICAL DATA: Crush injury to right foot and ankle. Initial
encounter.

EXAM:
RIGHT FOOT COMPLETE - 3+ VIEW

[foot ap]
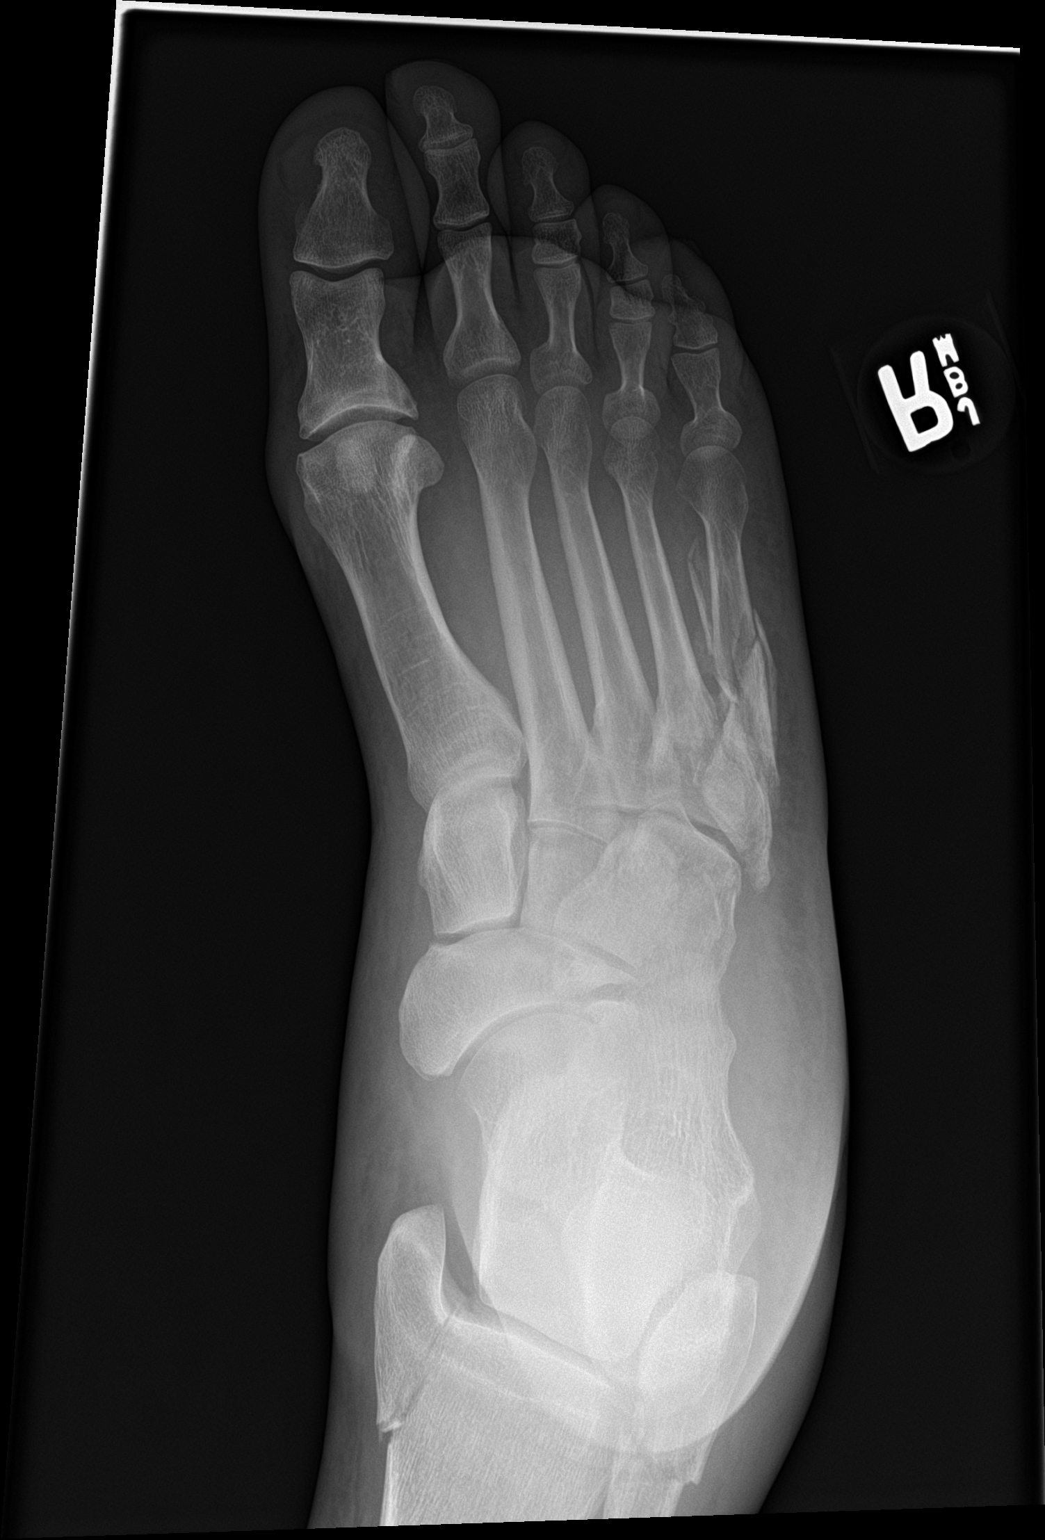

[foot obl]
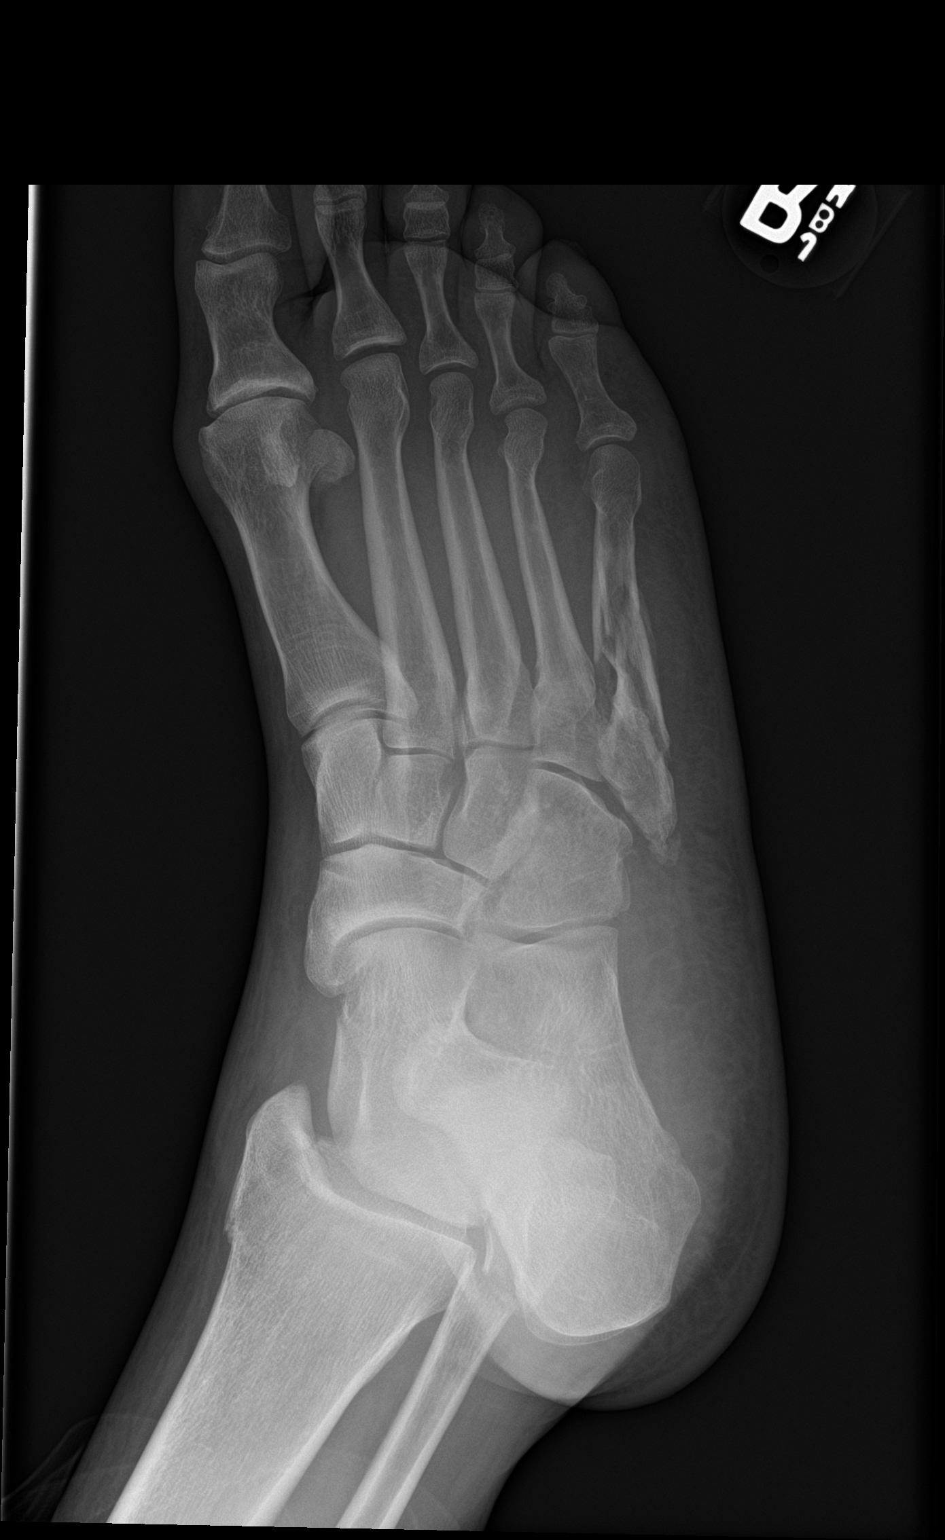

[foot lat]
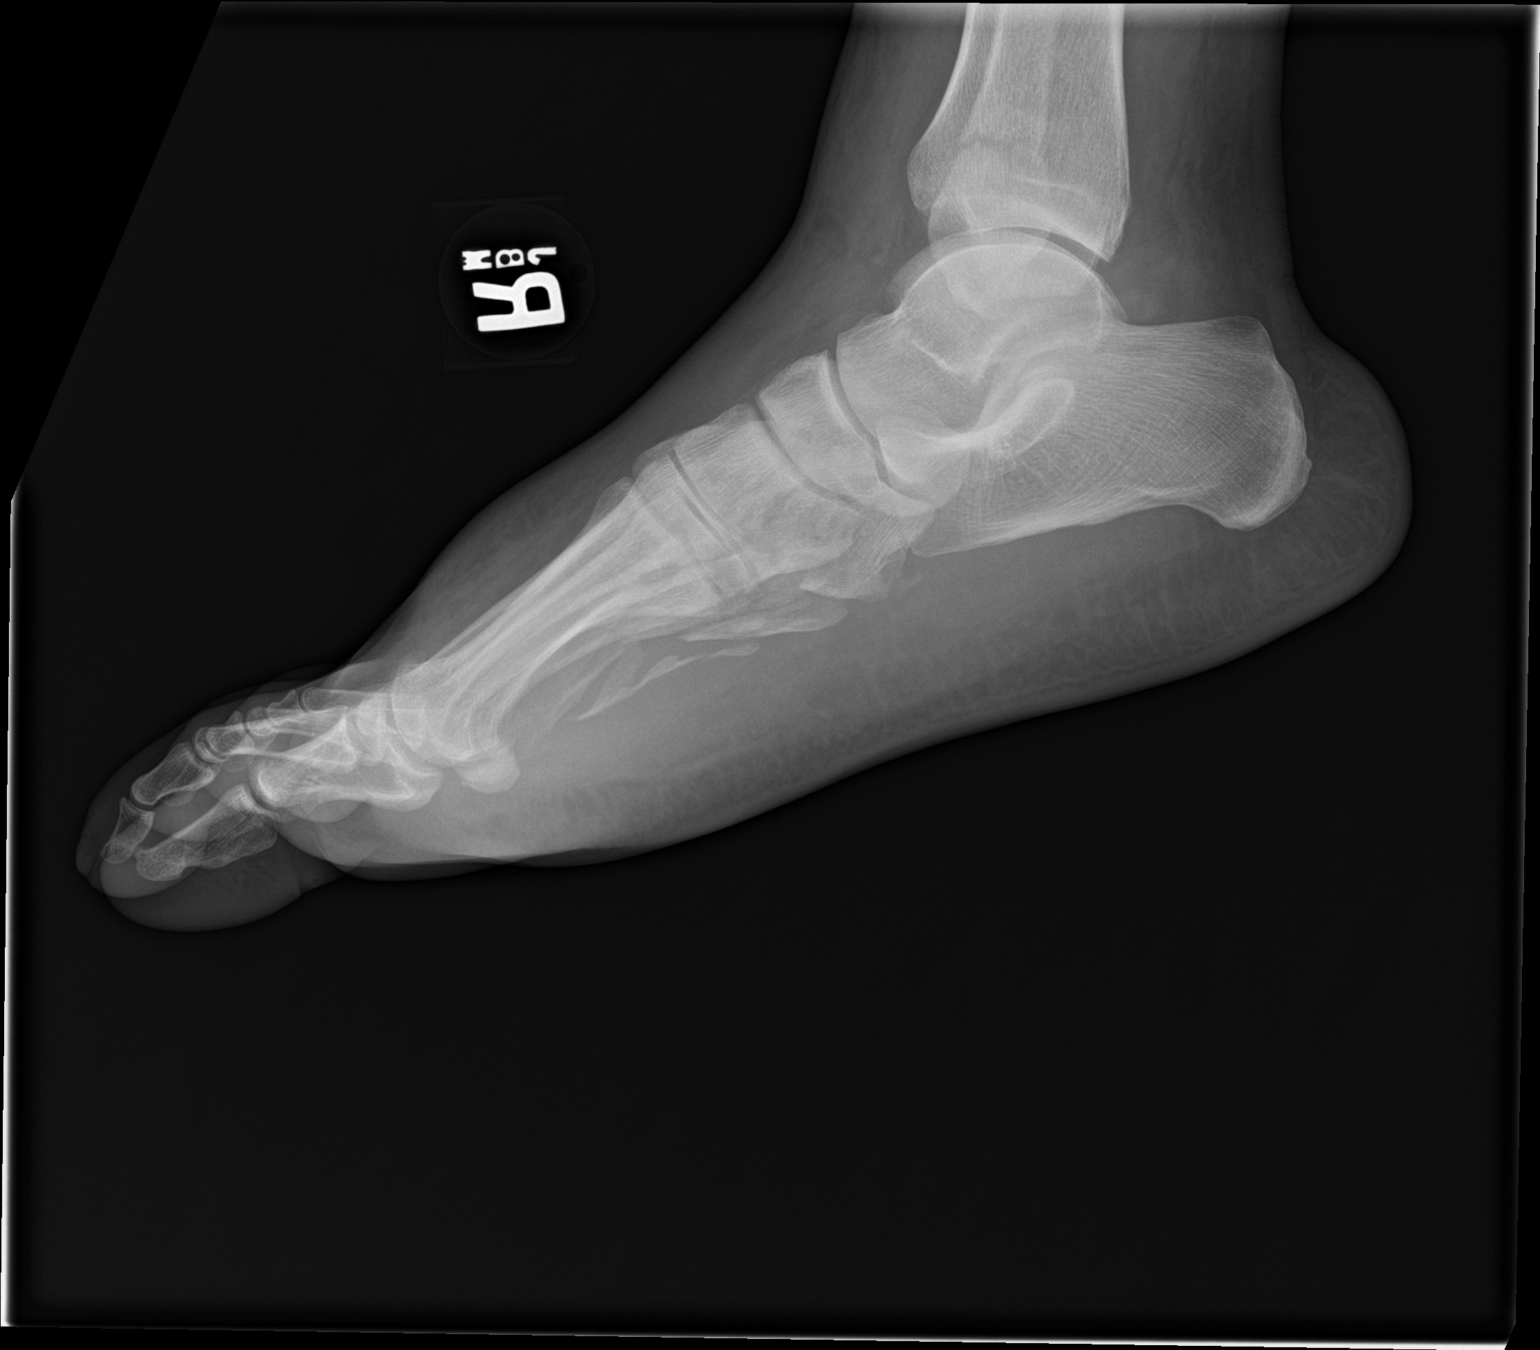

[3 of 3 positions shown; findings below may reference images not displayed]

FINDINGS: There is a comminuted fracture of the fifth metatarsal demonstrating
displacement of multiple fracture fragments. Avulsive fracture also
noted on the lateral view involving the plantar aspect of the
cuboid. Fracture planes are suspected to extend into the cuboid bone
and the cuboid bone is also mildly displaced in appearance. There is
also likely relatively nondisplaced fracture at the level of the
proximal fourth metatarsal.
IMPRESSION: 1. Comminuted fracture of the fifth metatarsal with displacement of
multiple fracture fragments.
2. Avulsive injury involving the plantar aspect of the cuboid. Other
fracture planes also may extend into the cuboid itself and the
cuboid appears rotated.
3. Probable nondisplaced/minimally displaced fracture at the base of
the fourth metatarsal.
